# Patient Record
Sex: Female | Born: 1935 | Race: White | Hispanic: No | Smoking: Former smoker
Health system: Southern US, Community
[De-identification: ages and names within clinical notes are randomized; demographics above are authoritative.]

## PROBLEM LIST (undated history)

## (undated) DIAGNOSIS — I10 Essential (primary) hypertension: Secondary | ICD-10-CM

## (undated) DIAGNOSIS — C50919 Malignant neoplasm of unspecified site of unspecified female breast: Secondary | ICD-10-CM

## (undated) DIAGNOSIS — D471 Chronic myeloproliferative disease: Secondary | ICD-10-CM

## (undated) DIAGNOSIS — R809 Proteinuria, unspecified: Secondary | ICD-10-CM

## (undated) DIAGNOSIS — E559 Vitamin D deficiency, unspecified: Secondary | ICD-10-CM

## (undated) DIAGNOSIS — M810 Age-related osteoporosis without current pathological fracture: Secondary | ICD-10-CM

## (undated) DIAGNOSIS — T783XXA Angioneurotic edema, initial encounter: Secondary | ICD-10-CM

## (undated) DIAGNOSIS — M549 Dorsalgia, unspecified: Secondary | ICD-10-CM

## (undated) HISTORY — DX: Chronic myeloproliferative disease: D47.1

## (undated) HISTORY — DX: Essential (primary) hypertension: I10

## (undated) HISTORY — DX: Proteinuria, unspecified: R80.9

## (undated) HISTORY — DX: Vitamin D deficiency, unspecified: E55.9

## (undated) HISTORY — DX: Malignant neoplasm of unspecified site of unspecified female breast: C50.919

## (undated) HISTORY — PX: SPINE SURGERY: SHX786

## (undated) HISTORY — DX: Dorsalgia, unspecified: M54.9

## (undated) HISTORY — DX: Angioneurotic edema, initial encounter: T78.3XXA

## (undated) HISTORY — PX: BREAST SURGERY: SHX581

## (undated) HISTORY — DX: Age-related osteoporosis without current pathological fracture: M81.0

---

## 2008-02-15 ENCOUNTER — Ambulatory Visit (HOSPITAL_COMMUNITY): Admission: RE | Admit: 2008-02-15 | Discharge: 2008-02-15 | Payer: Self-pay | Admitting: Neurosurgery

## 2008-02-20 ENCOUNTER — Inpatient Hospital Stay (HOSPITAL_COMMUNITY): Admission: EM | Admit: 2008-02-20 | Discharge: 2008-02-27 | Payer: Self-pay | Admitting: Emergency Medicine

## 2008-02-24 ENCOUNTER — Encounter: Payer: Self-pay | Admitting: Neurosurgery

## 2008-02-24 ENCOUNTER — Encounter (INDEPENDENT_AMBULATORY_CARE_PROVIDER_SITE_OTHER): Payer: Self-pay | Admitting: Neurosurgery

## 2008-02-25 ENCOUNTER — Ambulatory Visit: Payer: Self-pay | Admitting: Physical Medicine & Rehabilitation

## 2008-02-27 ENCOUNTER — Inpatient Hospital Stay (HOSPITAL_COMMUNITY)
Admission: RE | Admit: 2008-02-27 | Discharge: 2008-03-07 | Payer: Self-pay | Admitting: Physical Medicine & Rehabilitation

## 2008-06-27 ENCOUNTER — Encounter: Admission: RE | Admit: 2008-06-27 | Discharge: 2008-06-27 | Payer: Self-pay | Admitting: Neurosurgery

## 2009-02-24 ENCOUNTER — Ambulatory Visit (HOSPITAL_COMMUNITY): Admission: RE | Admit: 2009-02-24 | Discharge: 2009-02-24 | Payer: Self-pay | Admitting: Neurosurgery

## 2010-03-07 ENCOUNTER — Encounter: Admission: RE | Admit: 2010-03-07 | Discharge: 2010-03-07 | Payer: Self-pay | Admitting: Geriatric Medicine

## 2010-06-12 ENCOUNTER — Encounter: Payer: Self-pay | Admitting: Neurosurgery

## 2010-07-19 ENCOUNTER — Other Ambulatory Visit: Payer: Self-pay | Admitting: Neurosurgery

## 2010-07-19 DIAGNOSIS — M542 Cervicalgia: Secondary | ICD-10-CM

## 2010-07-26 ENCOUNTER — Ambulatory Visit
Admission: RE | Admit: 2010-07-26 | Discharge: 2010-07-26 | Disposition: A | Discharge status: Home / Self Care | Payer: Medicare Other | Source: Ambulatory Visit | Attending: Neurosurgery | Admitting: Neurosurgery

## 2010-07-26 DIAGNOSIS — M542 Cervicalgia: Secondary | ICD-10-CM

## 2010-07-26 MED ORDER — GADOBENATE DIMEGLUMINE 529 MG/ML IV SOLN
10.0000 mL | Freq: Once | INTRAVENOUS | Status: AC | PRN
  Administered 2010-07-26: 10 mL via INTRAVENOUS

## 2010-08-25 LAB — BASIC METABOLIC PANEL
BUN: 15 mg/dL (ref 6–23)
CO2: 32 mEq/L (ref 19–32)
Calcium: 10.2 mg/dL (ref 8.4–10.5)
Chloride: 97 mEq/L (ref 96–112)
Creatinine, Ser: 0.96 mg/dL (ref 0.4–1.2)

## 2010-08-25 LAB — CBC
MCHC: 33.7 g/dL (ref 30.0–36.0)
MCV: 95.6 fL (ref 78.0–100.0)
Platelets: 271 10*3/uL (ref 150–400)
WBC: 6.4 10*3/uL (ref 4.0–10.5)

## 2010-09-09 ENCOUNTER — Other Ambulatory Visit: Payer: Self-pay | Admitting: Dermatology

## 2010-10-04 NOTE — H&P (Signed)
[name], [name] NO.:  [id number]   MEDICAL RECORD NO.:  [id number]          PATIENT TYPE:  IPS   LOCATION:  NA                           FACILITY:  [facility]   PHYSICIAN:  Ellwood Dense, M.D.   DATE OF BIRTH:  June 04, 1931   DATE OF ADMISSION:  DATE OF DISCHARGE:                              HISTORY & PHYSICAL   PRIMARY CARE PHYSICIAN:  Dr. Geralynn Ochs in [place], [place], and  neurosurgeon, Dr. Terrilee Files.   HISTORY OF PRESENT ILLNESS:  [name] is a 77 year old Caucasian female  with history of hypertension and breast cancer.  She has a history of  pain and numbness and tingling of her left arm for the past few months  with an MRI scan approximately 3 weeks ago showing a C7-T1 tumor.   The patient was seen by Dr. Lovell Sheehan February 10, 2008, with unsteady  gait and myelopathic features.  Repeat scans were ordered.  The patient  developed increased problems with gait and perineal numbness and  incontinence and was admitted February 20, 2008, for ongoing workup and  treatment.   The patient was started on IV Decadron.  MRI study of the cervical spine  February 20, 2008, showed an enhancing nerve root or nerve root mass from  C3-T1 bilaterally with the most massive lesion at C8 with encroachment  on the spinal canal and bilateral core deformity with abnormal  enhancement of the C7 vertebral body.   The patient underwent bilateral C7-T1 laminectomy for resection of the  mass with posterior fusion C5-T2  Dr. Lovell Sheehan February 24, 2008.  The  pathology on the mass was positive for neurofibroma with no ongoing  chemotherapy or radiation recommended.  Therapy evals have been ongoing.  The patient is impulsive and needs queues to pace herself.  She has  limited use of the bilateral upper extremities with a rolling walker.   The patient was evaluated by the rehabilitation physicians and felt to  be an appropriate candidate for inpatient rehabilitation.   REVIEW OF  SYSTEMS:  Positive for wound healing, numbness of her buttock  and perineum, weakness and constipation.   PAST MEDICAL HISTORY:  1. History of breast cancer with left mastectomy in 1986.  2. 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  3. Hypertension.  4. Osteoporosis.  5. Hysterectomy.  6. Prior ovarian cyst excision.  7. Prior appendectomy.   FAMILY HISTORY:  Positive for diabetes mellitus and cancer.   SOCIAL HISTORY:  The patient is widowed and lives alone in a 1-level  home in [place], [place], with 4 steps to enter from the side with the  front steps being assessable over grass and then she has to negotiate 1  step.  There is a daughter and grandson who lives close by the patient  in [place], [place].  The patient does not use alcohol or tobacco.   FUNCTIONAL HISTORY PRIOR TO ADMISSION:  Independent and driving a few  weeks prior to admission.   ALLERGIES:  No known drug allergies.   MEDICATIONS PRIOR TO  ADMISSION:  1. Fosamax 70 mg every week.  2. Potassium chloride 20 mEq daily.  3. Quinapril 20 mg daily.  4. Hydrochlorothiazide 50 mg daily.  5. Calcium with vitamin D b.i.d.  6. Nystatin 4 times daily.  7. Decadron 4 mg t.i.d.  8. Aspirin 81 mg daily.   LABORATORY:  Recent hemoglobin was 14.4 with hematocrit of 43.0,  platelet count of 351,000 and white count of 18.6 on steroids.  Recent  sodium is 129, potassium 3.0, chloride 91, bicarbonate 29, BUN 34 and  creatinine 1.02 with glucose of 114.  Repeat sodium was 133, potassium  4.3, February 24, 2008, with repeat hemoglobin 13.9 and 41.0 February 24, 2008.  Abdominal x-rays showed mild to moderate amount of feces throughout the  colon February 21, 2008.   PHYSICAL EXAM:  Well-appearing elderly small adult female seated up in a  chair with hard cervical collar in place.  Blood pressure is 120/77 with pulse of 87, respiratory rate 20 and  temperature 98.1.  HEENT:   Normocephalic, nontraumatic.  CARDIOVASCULAR:  Regular rate and rhythm.  S1, S2 without murmurs.  ABDOMEN:  Soft, nontender with positive bowel sounds.  LUNGS:  Were clear to auscultation bilaterally.  The patient has a  cervical hard collar in place with a healing wound posteriorly.  NEUROLOGIC:  Alert and oriented x3.  Cranial nerves II-XII are intact.  Bilateral upper extremity exam showed 4-/5 strength throughout.  Bulk  and tone were normal.  Reflexes 2+ and symmetrical.  Sensation was  intact to light touch with bilateral upper extremities.  Lower extremity  exam showed 4-/5 strength in hip flexion, knee extension and ankle  dorsiflexion.  Sensation was intact to light touch with the bilateral  lower extremities.   DIAGNOSES:  1. Status post neurofibroma resection C7-T1 for cord      impingement/myelopathy with subsequent C5-T2 decompression/fusion      February 25, 2008.  2. Hyponatremia improving with recheck of labs in the morning.  3. Renal insufficiency on admission.  4. Hypertension.  5. Bowel constipation.   1. Presently the patient will be admitted to receive collaborative and      interdisciplinary care between the physiatrist, rehab nursing staff      and therapy team.  The patient's level of medical complexity and      substantial therapy needs in context of that medical necessity      cannot be provided at a lesser intensity of care.  Physiatrist will      provide 24-hour management of medical needs as well as oversight of      the therapy plan/treatment and provide guidance as appropriate      regarding interaction is his tube.  Twenty-four hour rehab nursing      will assist in management of bowel and bladder and help integrate      therapy concepts, techniques and education.  Physical therapy will      assess and treat for range of motion, strengthening, bed mobility,      transfers, pre-gait training, gait training and equipment eval.  2. Occupational therapy  will assess and treat for range of motion,      strengthening, ADLs, cognitive/perceptional training, splinting and      equipment eval.  3. Speech therapy will assess and treat for dysphagia, oral motor      exercise, communication aids, aphasia and vital stim along with      cognitive eval.  4. Case management and social  worker will assess and treat for      psychosocial issues and discharge planning as appropriate.  Team      conferences will be held weekly to [place] to establish goals, assess      progress and determine barriers to discharge.   PROGNOSIS:  Good.   ESTIMATED LENGTH OF STAY:  Ten to twenty days.   GOALS:  Modified independent, ADLs, transfers and ambulation household  distances.           ______________________________  Ellwood Dense, M.D.     DC/MEDQ  D:  02/27/2008  T:  02/27/2008  Job:  [id number]

## 2010-10-04 NOTE — Op Note (Signed)
[name], [name]              ACCOUNT NO.:  [id number]   MEDICAL RECORD NO.:  [id number]          PATIENT TYPE:  INP   LOCATION:  3114                         FACILITY:  [facility]   PHYSICIAN:  Cristi Loron, M.D.DATE OF BIRTH:  [birth year]   DATE OF PROCEDURE:  02/24/2008  DATE OF DISCHARGE:                               OPERATIVE REPORT   BRIEF HISTORY:  The patient is a 77 year old white female who has had  progressive difficulty with walking and more recently with some perineal  numbness.  She was worked up with a cervical MRI, which demonstrated the  patient had bilateral interspinal lesions at C7-T1, which were  compressing the spinal cord.  She had small releases elsewhere as well.  I discussed the various treatment options with the patient and her  family including surgery.  We fully discussed the surgery including the  risk of significant bilateral hand weakness following the surgery.  The  patient and her family have weighed the risks, benefits, and  alternatives of the surgery and desired to proceed with a cervical  laminectomy to remove the intraspinal lesion at C7-T1 bilaterally as  well as perform posterior fusion instrumentation.   PREOPERATIVE DIAGNOSES:  Bilateral C7-T1 intradural extramedullary  lesions, cervical myelopathy, radiculopathy, cervicalgia.   POSTOPERATIVE DIAGNOSES:  Bilateral C7-T1 intradural extramedullary  lesions, cervical myelopathy, radiculopathy, cervicalgia.   PROCEDURES:  C7 and T1 laminectomy for resection of bilateral intradural  extramedullary tumors using microdissection; posterior cervical fusion  C5-6, 6-7,  7-1, 1-2 with local morselized autograft bone and Actifuse  bone graft extender; posterior cervical instrumentation with lateral  mass and pedicle screws from C5-T1 bilaterally.   SURGEON:  Cristi Loron, MD   ASSISTANT:  Stefani Dama, MD   ANESTHESIA:  General endotracheal.   ESTIMATED BLOOD LOSS:  300 mL.   SPECIMENS:  Tumor.   DRAINS:  None.   COMPLICATIONS:  None.   DESCRIPTION OF PROCEDURE:  The patient was brought to the operating room  by the anesthesia team.  General endotracheal anesthesia was induced.  I  then applied the radiolucent Mayfield head holder to the patient's  calvarium.  The patient was then carefully turned to the prone position  on the chest rolls.  The patient's suboccipital region was then shaved  with clippers and this region as well as the neck and upper thorax was  then prepared with Betadine scrub and Betadine solution.  Sterile drapes  were applied.  The area to be incised was then injected with Marcaine  with epinephrine solution and then used a scalpel to make a linear  midline incision over the cervicothoracic junction.  We used  electrocautery to perform bilateral subperiosteal dissection exposing  spinous process lamina of C5-6, 7-1, 2-3.  We obtained intraoperative  radiograph to confirm our location.  We began the decompression by  incising the interspinous ligament at C6-7, 7-1, and 1-2.  I then used  Leksell rongeur to remove the spinous process at C7-T1.  We saved this  bone and later morsed, cleared off soft tissue and morselized it to be  used as local  autograft bone and fusion process.  We then brought the  operating microscope into the field.  Under its magnification  illumination, we completed the microdissection/decompression.  I used  high-speed drill to thin out the C7-T1 lamina until there was only very  thin rim of cortical bone, as well as exposing ligament flavum.  We then  used the Kerrison punch to complete the laminectomy at C7-T1 as well as  removed the ligament flavum at C6-7, 7-1, and 1-2 to expose the thecal  sac.  We then used high-speed to drill away the medial aspect of the  bilateral C7-T1 facet joint.  We identified the exiting C8 nerve roots  and performed wide foraminotomies.  I then removed a good bit of the  facet to  otain a broad exposure.  The nerve roots were obviously quite  enlarged.  We then incised the patient's dura in the midline over C7-T1  and then we made a cruciate extending incision down over the nerve root  sleeve.  We did this under the magnification illumination of the  microscope.  We then used microdissection to dissect through the  arachnoid and then the bilateral tumors were almost immediately evident.  We began the decompression on the left side.  We inspected the fairly  large tumor, which appeared to be likely a neurofibroma, which appeared  to be emanating from the dorsal rootlets.  We carefully identified the  proximal extent of this tumor.  We then incised it with the  microscissors from the spinal cord.  We then were able to peel the tumor  off laterally and then the specimen from the pathology came back  consistent with a neurofibroma.  I, therefore, removed the tumor.  We  were able to identify the ventral C8 nerve roots.  It did appear mildly  enlarged, but did not seem to contain obvious tumor.  We then used  microdissection and were able to remove or dissect the tumor, which  appeared to emanate from the dorsal nerve rootlets away from the ventral  nerve roots, i.e. we did this in order to hopefully preserve the  patient's hand strength.  We then used a CUSA to remove the tumor out  laterally about to the lateral aspect of the patient's facet joint.  After we were satisfied with this removal of the tumor from left side,  we repeated this procedure and now especially on the right side again  noting that the tumor seemed emanating from the dorsal nerve rootlets.  We dissect away ventral nerve roots and removed the tumor through the  extent of the neural foramen.  We did not followed the tumor outlet of  the soft tissues because at this point, we had a good decompression of  the spinal cord and we hoped to prefer to preserve the ventral nerve  rootlets.  We then obtained  hemostasis using bipolar cautery and  Gelfoam.  We then reapproximated the patient's dura using interrupted 4-  0 Nurolon suture.  Despite the extensive durotomy, we got a pretty good  dural closure.  We then irrigated the epidural space out with bacitracin  solution and then placed Tisseel over the durotomy.   Having completed the bilateral removal of this tumor, we now turned our  attention to the instrumentation.  Under AP fluoroscopic guidance, we  cannulated the bilateral T1 and T2 pedicles with the awl and then  drilled a 25 mm hole.  We probed inside the drill hole to rule out  cortical breeches.  We could not feel any using the ball probe.  We were  unable to get adequate lateral images and the placement of the screws  under AP fluoroscopy.  We then placed a 26 x 4 mm polyaxial screw in T1  and T2 pedicles laterally.  We got good bony purchase.  Under a lateral  fluoroscopic guidance, we then placed lateral mass screws bilaterally at  C5 and C6 (there was not enough facet left at C7 to place lateral mass  screw at that level).  We again got good bony purchase.  We then cut and  then bent rod in appropriate configuration and length, and then  connected unilateral screws with a rod.  We fastened into place with the  caps, which tightened appropriately.  We then placed a cross connector  and tightened appropriately.  This completed the instrumentation.   We now turned attention to the posterolateral arthrodesis.  We used high-  speed drill to decorticate the C5-C6 as well as T2 lamina and the  lateral masses at C5-6-7, T1-T2.  We then laid a combination of local  morselized autograft bone as well as Actifuse bone graft extender over  the decorticated posterior structures.  This completed the  posterolateral arthrodesis.   We then obtained hemostasis using bipolar cautery.  We irrigated the  wound out with bacitracin solution.  We removed the cerebellar  retractors.  We then  reapproximated the patient's cervical and thoracic  fascia with interrupted #1 Vicryl suture, subcutaneous tissue with  interrupted 2-0 Vicryl suture, and skin with a running 3-0 nylon suture.  We then coated the wound with bacitracin ointment, applied sterile  dressing, and then the drapes were removed.  The patient was subsequently turned to supine position.  The Mayfield 3-  point headrest was removed from her calvarium and she was subsequently  extubated and transported back to the ICU in stable condition.  All  sponge, instrument, and needle counts were correct at the end of this  case.      Cristi Loron, M.D.  Electronically Signed     JDJ/MEDQ  D:  02/24/2008  T:  02/25/2008  Job:  [id number]

## 2010-10-04 NOTE — H&P (Signed)
[name], Nielsen              ACCOUNT NO.:  [id number]   MEDICAL RECORD NO.:  [id number]          PATIENT TYPE:  INP   LOCATION:  3114                         FACILITY:  [facility]   PHYSICIAN:  Cristi Loron, M.D.DATE OF BIRTH:  11-Apr-1932   DATE OF ADMISSION:  02/20/2008  DATE OF DISCHARGE:                              HISTORY & PHYSICAL   Chief complaint is perineal numbness.   HISTORY OF PRESENT ILLNESS:  The patient is a 77 year old white female  who last couple of months had developed pain, numbness, and tingling to  her left arm.  She was worked up with a cervical MRI, which demonstrated  cervical C7-T1 tumor.  She was sent for my consultation.  I saw her in  the office on February 10, 2008.  At that time, the patient was  myelopathic and had difficulty with ambulation with unsteady gait.  Because the resolution of the scan that the patient brought in with her  was low, I recommend we get a repeat cervical MRI in a close scanner  with and without contrast.  The patient was sent to [facility]  for that scan on February 15, 2008.  Evidently, there was some issues  with her labs.  They could not confirm that her BUN and creatinine were  okay.  Review scan showed she got a noncontrasted cervical and thoracic  MRI scan.  I spoke to the patient via telephone this week and we  arranged for her to get  the remainder of the study, contrast scan and  that was due to be done next week and the patient was tentatively  scheduled for surgery next week.   In the interim, the patient feels that she is having more trouble when  walking.  She is quite unsteady on her feet, but that does not seem  significantly different than it was a couple of weeks ago.  She has  noticed some perineal numbness and at times has some incontinence.   I have recommended the patient to come to the emergency department and I  evaluated her there.   For past medical history, past surgical history,  medication prior to  admission, drug allergies, family history, and social history etc.,  please refer to my office consultation on February 10, 2008, which I  have provided through the chart.   PHYSICAL EXAMINATION:  GENERAL:  A pleasant 77 year old white female in  no apparent distress.  NEUROLOGIC:  The patient's neurologic exam is as follows.  She is alert  and oriented x3.  The patient's motor strength is as follows.  The  patient has weakness in the bilateral hands and approximately 4/5.  The  patient's motor strength in her all four extremities is somewhat  difficult to accurately assess as she gives away easily but including in  her deltoids, biceps, triceps above the lesion and her psoas quadriceps,  gastrocnemius, extensor hallucis longus versus strengthening these  muscle groups seems to be at least 4+/5.  The patient has decreased  light touch station in lower extremity at the perineal area but does  have crude  sensation.  Deep tendon reflexes are 2+/4 in bilateral  quadriceps and 1/4 bilateral gastrocnemius.  There was no ankle clonus.   ASSESSMENT/PLAN:  C7-T1 tumor.  I have discussed the situation with the  patient and her son.  I have recommended that she be admitted for  observation and then we get a fusion cervical MRI today to better  characterize these lesions.  I have reviewed the patient's cervical MRI  scan for [name] on February 15, 2008, without contrast as  well as a thoracic MRI demonstrates bilateral lesions within neural  foramen, which resulted in some moderate spinal stenosis as above  because this is a noncontrast scan.  I did not see any significant  lesions in the thoracic region.  There is perhaps a small neurofibroma  C6-7.   ASSESSMENT AND PLAN:  Bilateral C7-T1 tumors.  These tumors appear to be  nursing to nerve sheath nerve tumors such as schwannomas, neurofibromas,  etc.  The patient's clinical exam has not changed dramatically  and she  has fairly normal strength except her hands.  I have recommended we  admit her for observation.  We started her on IV steroids and we get a  cervical MRI with contrast.   I again discussed surgery with her i.e. cervical laminectomy and removal  of these tumors with a cervical thoracic instrumentation and fusion.  I  told her that was she to do this surgery on February 24, 2008, but that if  she were to worsen in the interval, we may need to move up the surgery  date.  We will keep a close eye on her in the ICU.   I have answered all the patient's and her son's questions.      Cristi Loron, M.D.  Electronically Signed     JDJ/MEDQ  D:  02/20/2008  T:  02/21/2008  Job:  [id number]   cc:   Concepcion Living

## 2010-10-04 NOTE — Discharge Summary (Signed)
[name], [name]              ACCOUNT NO.:  [id number]   MEDICAL RECORD NO.:  [id number]          PATIENT TYPE:  IPS   LOCATION:  4004                         FACILITY:  [facility]   PHYSICIAN:  Ranelle Oyster, M.D.DATE OF BIRTH:  1931-08-16   DATE OF ADMISSION:  02/27/2008  DATE OF DISCHARGE:  03/07/2008                               DISCHARGE SUMMARY   DISCHARGE DIAGNOSES:  1. 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  2. Hyponatremia, improved.  3. Hypokalemia resolved with supplementation.  4. Hypertension.  5. Neurogenic bowel and bladder with improved function.  6. Klebsiella urinary tract infection, treated.   HISTORY OF PRESENT ILLNESS:  [name] is a 77 year old female with  history of hypertension, breast cancer, and a pain, numbness, and  tingling left arm for few months.  MRI done revealed C7-T1 tumor.  The  patient was seen by Dr. Lovell Sheehan on February 10, 2008 with unsteady gait  and myelopathy with repeat scans ordered.  The patient developed  increased problems with gait with perineal numbness and incoordination  and was admitted February 20, 2008 for workup and treatment.  The patient  was started on IV Decadron.  MRI of C-spine on February 20, 2008 showed  enhancing nerve root or nerve root masses from C3-T1 bilaterally, most  massive at CA with encroachment of spinal canal and bilateral cord  deformity with abnormal enhancement of C7.  The patient underwent  bilateral C7-T1 lam for resection of masses with posterior fusion C5-T2  by Dr. Lovell Sheehan on February 24, 2008.  Pathology was positive for  neurofibroma.  PT/OT evaluation was done and currently ongoing, the  patient is noted to have impairments in mobility and self-care and noted  to be impulsive needing cues to pace herself.  Noted to have limited use  of bilateral upper extremity and rolling walker.  Rehab was consulted  for progressive therapies.   PAST MEDICAL HISTORY:  Significant  for breast cancer with left  mastectomy in 1986, excision of squamous cell and basal cell cancer from  facial areas, hypertension, osteoporosis, hysterectomy, excision of  ovarian cyst, and appendectomy.   ALLERGIES:  No known drug allergies.   FAMILY HISTORY:  Positive for diabetes and cancer.   SOCIAL HISTORY:  The patient is widowed, lives alone in one level home  with 4 steps at entry.  Family to assist a few weeks past discharge.  The patient does not use any tobacco or alcohol.   FUNCTIONAL HISTORY:  The patient was independent in driving until few  weeks prior to admission.   FUNCTIONAL STATUS:  The patient is min assist for bed mobility, min  assist for transfers, min assist for transfers, min assist for  ambulating 60 feet.  Currently requires min assist for bathing, mod  assist for dressing.   HOSPITAL COURSE:  [name] was admitted to rehab on February 27, 2008 for inpatient therapies to consist of PT/OT and speech therapy at  least 5 days a week.  During his stay in rehab, a rehab RN has been  following and assisting with bowel and bladder training,  wound care as  well as skin care monitoring.  We have also been encouraging the patient  to improve her p.o. intake, to maintain hydration and nutritional  status.  Physiatry speech rehab RN and Therapy Team have been working  together to provide customized collaborative interdisciplinary care  during this stay.  Rehab Team has been holding weekly conferences to  assess the patient's progress goals and discuss barriers to discharge.  The patient was noted to have issues with hyponatremia with sodium at  129 at admission.  Her routine check of lytes have been monitored during  this stay with close monitoring of her hyponatremia.  At time of  admission, sodium was noted to be at 133.  Initially, the patient was  maintained on hydrochlorothiazide 50 mg a day without any potassium  supplements.  On recheck of lytes, she  was noted to be hypokalemic with  potassium down to 3.2.  She was aggressively supplemented.  The patient  reports taking potassium supplements on b.i.d. basis at home.  This was  resumed.  Last check of lytes of March 06, 2008 shows improvement in  her hypokalemia with sodium 133, potassium 3.4, chloride 93, CO2 31, BUN  13, creatinine 0.85, and glucose 104.  The patient to be discharged on  b.i.d. potassium supplements with follow up at [facility].  The patient's  blood pressures have been checked on b.i.d. basis during this stay.  These have been variable, ranging from 100s to 120 systolic, 60s to 16X  diastolic.  Heart rate has been stable.  Last weight is at 53 kg.  The  patient reported issues with constipation.  Bowel program was initiated  with use of multiple laxatives as well as enemas to help with  evacuation.  The KUB was done on March 03, 2008, to see effectiveness  of bowel program.  This showed large amount of colonic stool, although  decreased stool in rectosigmoid since the prior exam of February 21, 2008.  The patient was treated with magnesium citrate rate and current bowel  program has been adjusted to Senokot S one b.i.d. with milk of magnesia  at bedtime and Dulcolax suppository in the a.m. if no results.  The  patient is advised to keep to this program for now and to adjust it up  or down to help with workup any issues regarding constipation.  Currently, the patient reports bowel movement couple of times a day  without any complaints of GI distress.   The patient's voiding was monitored with PVR checks with nursing setting  a toileting schedule to help the patient with bladder program.  The  patient was noted to have issues with urinary retention and she was  started on Urecholine 25 mg with t.i.d.  UA/UC  was sent off and the  patient was noted to have Klebsiella UTI and has been treated with 7 day  Cipro for this.  Past discharge, the patient to be tapered off   Urecholine over the next 10 days.  The patient's pain control has been  reasonable with a p.r.n. use of oxycodone.  In the last 24 hours prior  to discharge, the patient reports much improvement in pain management  with no limitations due to pain.  At time of admission, the patient was  noted to be limited by pain in upper thoracic region with movement.  Rehab RN has been working with therapy to premedicate the patient with  pain meds prior to therapy to help improve her  participation and  endurance of therapies.  The patient was noted to have decrease in  bilateral upper extremity strength, a decrease in left upper extremity  sensation, and decreased in dynamic and static standing balance  requiring min to mod assist for all functional mobility.  OT has been  working with the patient to help with bilateral upper extremity  strengthening with bilateral upper extremity exercises to perform as  well as dynamic standing balance with activity to focus on balance with  rolling walker support as well as rolling walker safety.  The patient is  currently able to perform bathing and dressing at shower level at  modified independent level with assist to change pads and collar.  She  requires supervision to step over ledge and shower stall.  Therapy has  also been working on challenging activities in picking items off floor  and the patient is currently mod independent with the use of rolling  walker for support.  Physical therapy has been working with the patient  for mobility.  Currently, the patient has progressed to being modified  independent for transfers, modified independent for ambulating 300 feet  on unit with rolling walker, requires supervision for high car transfer,  able to navigate 5 steps with supervision, 12 short steps with cane with  supervision only.  The patient and son and have fully participated in  transfer and mobility training.  The patient still presents with  decreased  balance with higher level activities.  Family is to provide  intermittent supervision for navigation of stairs and cars and during  the day at home as needed.  The patient will also continue with further  progressive home health PT/OT by [facility] past  discharge.  On March 06, 2008, the patient is discharged to home.   DISCHARGE MEDICATIONS:  1. Hydrochlorothiazide 50 mg p.o. per day.  2. Multivitamin one per day.  3. K-Dur 20 mEq b.i.d.  4. Pepcid 20 mg b.i.d.  5. Os-Cal plus D one p.o. b.i.d.  6. Quinapril 20 mg a day.  7. Senokot-S one p.o. b.i.d.  8. Dulcolax suppository one q.a.m. if no BM.  9. Milk of magnesia 30 mL p.o. per day at bedtime.  10.OxyIR 5 mg one to two q.4-6h. p.r.n. pain, #50 RX.  11.Cipro 250 mg one p.o. b.i.d. for 2 additional days.  12.Urecholine 25 mg one p.o. b.i.d. for 5 days then decrease to one      per day until gone.  13.Tylenol 325 mg 1-2 q.4h. p.r.n. pain.   Diet is low-salt.  Activity level is at intermittent supervision.  No  strenuous activity.  No alcohol, no smoking, no driving.  Special  instructions keep neck incision clean and dry.  Wear a collar at all  times.   FOLLOWUP:  The patient to follow up with Dr. Riley Kill as needed.  Followup  with Dr. Tressie Stalker for postop check and for input regarding  cervical collar.  Followup with Dr. Elijah Birk in 2 weeks for routine  check as well as check of lytes.      Greg Cutter, P.A.      Ranelle Oyster, M.D.  Electronically Signed    PP/MEDQ  D:  03/06/2008  T:  03/07/2008  Job:  [id number]   cc:   Cristi Loron, M.D.  Dr. Elijah Birk

## 2010-10-07 NOTE — Discharge Summary (Signed)
NAMEASAL, TEAS              ACCOUNT NO.:  [id number]   MEDICAL RECORD NO.:  [id number]          PATIENT TYPE:  INP   LOCATION:  3039                         FACILITY:  [facility]   PHYSICIAN:  Cristi Loron, M.D.DATE OF BIRTH:  07/11/1931   DATE OF ADMISSION:  02/20/2008  DATE OF DISCHARGE:  02/27/2008                               DISCHARGE SUMMARY   BRIEF HISTORY:  The patient is a 77 year old white female who has had  progressive difficulty walking and more recently with some perineal  numbness.  She was worked up with a cervical MRI which demonstrated the  patient with bilateral intraspinal lesion to C7-T1 which were  compressing spinal cord.  She has small tumors elsewhere.  I have  discussed various treatment options with the patient and her family  including surgery.  We fully discussed the surgery and the risk of  significant bilateral hand weakness following the surgery.  The patient  and her family weighed the risks, benefits, and alternatives of the  surgery and decided to proceed with a cervical laminectomy to remove the  intraspinal tumor at C7-T1 as well as perform posterior cervical  instrumentation and fusion.   For further details of this admission, please refer to typed history and  physical.   HOSPITAL COURSE:  I admitted the patient to [facility] on  February 20, 2008.  On February 24, 2008, I performed a C7-T1 laminectomy  for resection of bilateral intradural extramedullary tumors using  microdissection as well as a posterior instrumentation of C5-T1  bilaterally.  The surgery went well (for full details of this operation,  please refer to the typed operative note).   POSTOPERATIVE COURSE:  The patient's postoperative course was  unremarkable.  By February 27, 2008, the patient was afebrile.  Vital  signs stable.  She was eating well, ambulating well and she was felt to  be stable for transferring the patient to rehab unit.  She was  transferred on  February 27, 2008.   DISCHARGE INSTRUCTIONS:  The patient is instructed to follow up with me  in 4 weeks.  She was given written discharge instructions.   FINAL DIAGNOSES:  1. Bilateral C7-T1 intradural extramedullary lesions.  2. Cervical myelopathy.  3. Radiculopathy.  4. Cervicalgia.   PROCEDURE PERFORMED:  C7-T1 laminectomy for resection of bilateral  intradural extramedullary tumors using microdissection; posterior  cervical fusion of C5-C6, C6-C7, C7-T1, and T1-T2 with local morselized  autograft bone and Actifuse bone graft extender; posterior cervical  instrumentation with bilateral lateral mass screws from C5-T1  bilaterally.     Cristi Loron, M.D.  Electronically Signed    JDJ/MEDQ  D:  04/02/2008  T:  04/03/2008  Job:  [id number]

## 2011-01-11 ENCOUNTER — Other Ambulatory Visit: Payer: Self-pay | Admitting: Dermatology

## 2011-02-21 LAB — COMPREHENSIVE METABOLIC PANEL
ALT: 25
AST: 27
Albumin: 3.4 — ABNORMAL LOW
Alkaline Phosphatase: 34 — ABNORMAL LOW
CO2: 31
Calcium: 8.3 — ABNORMAL LOW
Chloride: 91 — ABNORMAL LOW
Creatinine, Ser: 0.55
GFR calc Af Amer: >60
GFR calc non Af Amer: >60
Glucose, Bld: 99
Potassium: 3 — ABNORMAL LOW
Sodium: 133 — ABNORMAL LOW
Total Bilirubin: 1
Total Protein: 4.3 — ABNORMAL LOW

## 2011-02-21 LAB — BASIC METABOLIC PANEL
CO2: 29
Calcium: 8.9
Calcium: 9
Chloride: 97
GFR calc Af Amer: >60
GFR calc Af Amer: >60
GFR calc non Af Amer: >60
GFR calc non Af Amer: >60
Glucose, Bld: 104 — ABNORMAL HIGH
Glucose, Bld: 99
Potassium: 3.2 — ABNORMAL LOW
Potassium: 4
Sodium: 129 — ABNORMAL LOW
Sodium: 133 — ABNORMAL LOW
Sodium: 134 — ABNORMAL LOW

## 2011-02-21 LAB — DIFFERENTIAL
Basophils Absolute: 0
Basophils Relative: 0
Eosinophils Absolute: 0
Eosinophils Relative: 0
Lymphocytes Relative: 17
Lymphocytes Relative: 8 — ABNORMAL LOW
Lymphs Abs: 1.7
Monocytes Absolute: 1.5 — ABNORMAL HIGH
Monocytes Relative: 7
Neutro Abs: 7.4
Neutrophils Relative %: 73

## 2011-02-21 LAB — URINE CULTURE: Special Requests: NEGATIVE

## 2011-02-21 LAB — POCT I-STAT 4, (NA,K, GLUC, HGB,HCT)
Glucose, Bld: 138 — ABNORMAL HIGH
HCT: 33 — ABNORMAL LOW
Hemoglobin: 11.2 — ABNORMAL LOW
Hemoglobin: 13.9
Potassium: 4.1
Potassium: 4.3
Sodium: 132 — ABNORMAL LOW

## 2011-02-21 LAB — HEMOGLOBIN A1C
Hgb A1c MFr Bld: 5.8
Mean Plasma Glucose: 120

## 2011-02-21 LAB — URINALYSIS, ROUTINE W REFLEX MICROSCOPIC
Glucose, UA: NEGATIVE
Glucose, UA: NEGATIVE
Hgb urine dipstick: NEGATIVE
Ketones, ur: NEGATIVE
Ketones, ur: NEGATIVE
Protein, ur: NEGATIVE
pH: 6
pH: 8

## 2011-02-21 LAB — GLUCOSE, CAPILLARY: Glucose-Capillary: 132 — ABNORMAL HIGH

## 2011-02-21 LAB — CBC
Hemoglobin: 10.8 — ABNORMAL LOW
Hemoglobin: 9.9 — ABNORMAL LOW
MCHC: 33.6
MCHC: 33.9
MCV: 98.8
MCV: 98.9
Platelets: 351
RBC: 2.99 — ABNORMAL LOW
RBC: 3.23 — ABNORMAL LOW
RBC: 4.4
RDW: 13.5
WBC: 17.1 — ABNORMAL HIGH
WBC: 18.6 — ABNORMAL HIGH

## 2011-02-21 LAB — MAGNESIUM: Magnesium: 2

## 2011-02-21 LAB — POCT I-STAT GLUCOSE: Operator id: 173791

## 2011-02-21 LAB — URINE MICROSCOPIC-ADD ON

## 2011-03-07 ENCOUNTER — Other Ambulatory Visit: Payer: Self-pay | Admitting: Dermatology

## 2011-04-27 ENCOUNTER — Other Ambulatory Visit: Payer: Self-pay | Admitting: Dermatology

## 2011-06-27 DIAGNOSIS — Z833 Family history of diabetes mellitus: Secondary | ICD-10-CM | POA: Diagnosis not present

## 2011-06-27 DIAGNOSIS — R9431 Abnormal electrocardiogram [ECG] [EKG]: Secondary | ICD-10-CM | POA: Diagnosis not present

## 2011-06-27 DIAGNOSIS — Z01810 Encounter for preprocedural cardiovascular examination: Secondary | ICD-10-CM | POA: Diagnosis not present

## 2011-06-27 DIAGNOSIS — Z87891 Personal history of nicotine dependence: Secondary | ICD-10-CM | POA: Diagnosis not present

## 2011-06-27 DIAGNOSIS — Z9010 Acquired absence of unspecified breast and nipple: Secondary | ICD-10-CM | POA: Diagnosis not present

## 2011-06-27 DIAGNOSIS — Z85828 Personal history of other malignant neoplasm of skin: Secondary | ICD-10-CM | POA: Diagnosis not present

## 2011-06-27 DIAGNOSIS — H02429 Myogenic ptosis of unspecified eyelid: Secondary | ICD-10-CM | POA: Diagnosis not present

## 2011-06-27 DIAGNOSIS — I498 Other specified cardiac arrhythmias: Secondary | ICD-10-CM | POA: Diagnosis not present

## 2011-06-27 DIAGNOSIS — I10 Essential (primary) hypertension: Secondary | ICD-10-CM | POA: Diagnosis not present

## 2011-06-27 DIAGNOSIS — Z8249 Family history of ischemic heart disease and other diseases of the circulatory system: Secondary | ICD-10-CM | POA: Diagnosis not present

## 2011-06-27 DIAGNOSIS — Z90710 Acquired absence of both cervix and uterus: Secondary | ICD-10-CM | POA: Diagnosis not present

## 2011-06-27 DIAGNOSIS — Z853 Personal history of malignant neoplasm of breast: Secondary | ICD-10-CM | POA: Diagnosis not present

## 2011-07-05 DIAGNOSIS — Z85828 Personal history of other malignant neoplasm of skin: Secondary | ICD-10-CM | POA: Diagnosis not present

## 2011-07-05 DIAGNOSIS — H02429 Myogenic ptosis of unspecified eyelid: Secondary | ICD-10-CM | POA: Diagnosis not present

## 2011-07-05 DIAGNOSIS — C50919 Malignant neoplasm of unspecified site of unspecified female breast: Secondary | ICD-10-CM | POA: Diagnosis not present

## 2011-07-05 DIAGNOSIS — Z9010 Acquired absence of unspecified breast and nipple: Secondary | ICD-10-CM | POA: Diagnosis not present

## 2011-07-05 DIAGNOSIS — Z8249 Family history of ischemic heart disease and other diseases of the circulatory system: Secondary | ICD-10-CM | POA: Diagnosis not present

## 2011-07-05 DIAGNOSIS — Z90710 Acquired absence of both cervix and uterus: Secondary | ICD-10-CM | POA: Diagnosis not present

## 2011-07-05 DIAGNOSIS — Z9849 Cataract extraction status, unspecified eye: Secondary | ICD-10-CM | POA: Diagnosis not present

## 2011-07-05 DIAGNOSIS — I10 Essential (primary) hypertension: Secondary | ICD-10-CM | POA: Diagnosis not present

## 2011-07-05 DIAGNOSIS — Z833 Family history of diabetes mellitus: Secondary | ICD-10-CM | POA: Diagnosis not present

## 2011-07-05 DIAGNOSIS — H02409 Unspecified ptosis of unspecified eyelid: Secondary | ICD-10-CM | POA: Diagnosis not present

## 2011-07-05 DIAGNOSIS — Z87891 Personal history of nicotine dependence: Secondary | ICD-10-CM | POA: Diagnosis not present

## 2011-07-10 DIAGNOSIS — H02409 Unspecified ptosis of unspecified eyelid: Secondary | ICD-10-CM | POA: Diagnosis not present

## 2011-07-10 DIAGNOSIS — Z4802 Encounter for removal of sutures: Secondary | ICD-10-CM | POA: Diagnosis not present

## 2011-07-13 DIAGNOSIS — I10 Essential (primary) hypertension: Secondary | ICD-10-CM | POA: Diagnosis not present

## 2011-07-13 DIAGNOSIS — Z79899 Other long term (current) drug therapy: Secondary | ICD-10-CM | POA: Diagnosis not present

## 2011-07-13 DIAGNOSIS — R209 Unspecified disturbances of skin sensation: Secondary | ICD-10-CM | POA: Diagnosis not present

## 2011-08-08 DIAGNOSIS — H02409 Unspecified ptosis of unspecified eyelid: Secondary | ICD-10-CM | POA: Diagnosis not present

## 2011-09-12 DIAGNOSIS — M961 Postlaminectomy syndrome, not elsewhere classified: Secondary | ICD-10-CM | POA: Diagnosis not present

## 2011-09-12 DIAGNOSIS — IMO0002 Reserved for concepts with insufficient information to code with codable children: Secondary | ICD-10-CM | POA: Diagnosis not present

## 2011-10-31 DIAGNOSIS — I10 Essential (primary) hypertension: Secondary | ICD-10-CM | POA: Diagnosis not present

## 2011-10-31 DIAGNOSIS — K458 Other specified abdominal hernia without obstruction or gangrene: Secondary | ICD-10-CM | POA: Diagnosis not present

## 2011-12-11 DIAGNOSIS — N952 Postmenopausal atrophic vaginitis: Secondary | ICD-10-CM | POA: Diagnosis not present

## 2011-12-11 DIAGNOSIS — Z1212 Encounter for screening for malignant neoplasm of rectum: Secondary | ICD-10-CM | POA: Diagnosis not present

## 2011-12-11 DIAGNOSIS — Z01419 Encounter for gynecological examination (general) (routine) without abnormal findings: Secondary | ICD-10-CM | POA: Diagnosis not present

## 2011-12-11 DIAGNOSIS — R32 Unspecified urinary incontinence: Secondary | ICD-10-CM | POA: Diagnosis not present

## 2011-12-11 DIAGNOSIS — Z1231 Encounter for screening mammogram for malignant neoplasm of breast: Secondary | ICD-10-CM | POA: Diagnosis not present

## 2012-01-31 DIAGNOSIS — Z1331 Encounter for screening for depression: Secondary | ICD-10-CM | POA: Diagnosis not present

## 2012-01-31 DIAGNOSIS — M21619 Bunion of unspecified foot: Secondary | ICD-10-CM | POA: Diagnosis not present

## 2012-01-31 DIAGNOSIS — I10 Essential (primary) hypertension: Secondary | ICD-10-CM | POA: Diagnosis not present

## 2012-01-31 DIAGNOSIS — M810 Age-related osteoporosis without current pathological fracture: Secondary | ICD-10-CM | POA: Diagnosis not present

## 2012-01-31 DIAGNOSIS — Z0000 Encounter for general adult medical examination without abnormal findings: Secondary | ICD-10-CM | POA: Diagnosis not present

## 2012-01-31 DIAGNOSIS — Z79899 Other long term (current) drug therapy: Secondary | ICD-10-CM | POA: Diagnosis not present

## 2012-01-31 DIAGNOSIS — R413 Other amnesia: Secondary | ICD-10-CM | POA: Diagnosis not present

## 2012-01-31 DIAGNOSIS — K1370 Unspecified lesions of oral mucosa: Secondary | ICD-10-CM | POA: Diagnosis not present

## 2012-02-01 DIAGNOSIS — R6889 Other general symptoms and signs: Secondary | ICD-10-CM | POA: Diagnosis not present

## 2012-03-12 DIAGNOSIS — M25549 Pain in joints of unspecified hand: Secondary | ICD-10-CM | POA: Diagnosis not present

## 2012-03-12 DIAGNOSIS — M4120 Other idiopathic scoliosis, site unspecified: Secondary | ICD-10-CM | POA: Diagnosis not present

## 2012-04-03 DIAGNOSIS — L723 Sebaceous cyst: Secondary | ICD-10-CM | POA: Diagnosis not present

## 2012-04-03 DIAGNOSIS — R269 Unspecified abnormalities of gait and mobility: Secondary | ICD-10-CM | POA: Diagnosis not present

## 2012-04-03 DIAGNOSIS — M810 Age-related osteoporosis without current pathological fracture: Secondary | ICD-10-CM | POA: Diagnosis not present

## 2012-04-03 DIAGNOSIS — Z23 Encounter for immunization: Secondary | ICD-10-CM | POA: Diagnosis not present

## 2012-04-03 DIAGNOSIS — R413 Other amnesia: Secondary | ICD-10-CM | POA: Diagnosis not present

## 2012-07-24 DIAGNOSIS — I129 Hypertensive chronic kidney disease with stage 1 through stage 4 chronic kidney disease, or unspecified chronic kidney disease: Secondary | ICD-10-CM | POA: Diagnosis not present

## 2012-07-24 DIAGNOSIS — J309 Allergic rhinitis, unspecified: Secondary | ICD-10-CM | POA: Diagnosis not present

## 2012-07-24 DIAGNOSIS — R413 Other amnesia: Secondary | ICD-10-CM | POA: Diagnosis not present

## 2012-07-30 DIAGNOSIS — H9209 Otalgia, unspecified ear: Secondary | ICD-10-CM | POA: Diagnosis not present

## 2012-08-05 ENCOUNTER — Other Ambulatory Visit: Payer: Self-pay | Admitting: Geriatric Medicine

## 2012-08-06 ENCOUNTER — Ambulatory Visit
Admission: RE | Admit: 2012-08-06 | Discharge: 2012-08-06 | Disposition: A | Discharge status: Home / Self Care | Payer: Medicare Other | Source: Ambulatory Visit | Attending: Geriatric Medicine | Admitting: Geriatric Medicine

## 2012-08-06 DIAGNOSIS — R110 Nausea: Secondary | ICD-10-CM

## 2012-08-06 DIAGNOSIS — I6350 Cerebral infarction due to unspecified occlusion or stenosis of unspecified cerebral artery: Secondary | ICD-10-CM | POA: Diagnosis not present

## 2012-08-06 MED ORDER — GADOBENATE DIMEGLUMINE 529 MG/ML IV SOLN
11.0000 mL | Freq: Once | INTRAVENOUS | Status: AC | PRN
  Administered 2012-08-06: 11 mL via INTRAVENOUS

## 2012-08-20 DIAGNOSIS — R269 Unspecified abnormalities of gait and mobility: Secondary | ICD-10-CM | POA: Diagnosis not present

## 2012-08-20 DIAGNOSIS — I10 Essential (primary) hypertension: Secondary | ICD-10-CM | POA: Diagnosis not present

## 2012-08-20 DIAGNOSIS — I6350 Cerebral infarction due to unspecified occlusion or stenosis of unspecified cerebral artery: Secondary | ICD-10-CM | POA: Diagnosis not present

## 2012-08-23 DIAGNOSIS — G8100 Flaccid hemiplegia affecting unspecified side: Secondary | ICD-10-CM | POA: Diagnosis not present

## 2012-08-23 DIAGNOSIS — R269 Unspecified abnormalities of gait and mobility: Secondary | ICD-10-CM | POA: Diagnosis not present

## 2012-08-26 DIAGNOSIS — R269 Unspecified abnormalities of gait and mobility: Secondary | ICD-10-CM | POA: Diagnosis not present

## 2012-08-26 DIAGNOSIS — G8100 Flaccid hemiplegia affecting unspecified side: Secondary | ICD-10-CM | POA: Diagnosis not present

## 2012-08-28 DIAGNOSIS — G8100 Flaccid hemiplegia affecting unspecified side: Secondary | ICD-10-CM | POA: Diagnosis not present

## 2012-08-28 DIAGNOSIS — R269 Unspecified abnormalities of gait and mobility: Secondary | ICD-10-CM | POA: Diagnosis not present

## 2012-09-02 DIAGNOSIS — IMO0002 Reserved for concepts with insufficient information to code with codable children: Secondary | ICD-10-CM | POA: Diagnosis not present

## 2012-09-02 DIAGNOSIS — R269 Unspecified abnormalities of gait and mobility: Secondary | ICD-10-CM | POA: Diagnosis not present

## 2012-09-02 DIAGNOSIS — G8100 Flaccid hemiplegia affecting unspecified side: Secondary | ICD-10-CM | POA: Diagnosis not present

## 2012-09-05 DIAGNOSIS — R269 Unspecified abnormalities of gait and mobility: Secondary | ICD-10-CM | POA: Diagnosis not present

## 2012-09-05 DIAGNOSIS — G8100 Flaccid hemiplegia affecting unspecified side: Secondary | ICD-10-CM | POA: Diagnosis not present

## 2012-09-09 DIAGNOSIS — G8100 Flaccid hemiplegia affecting unspecified side: Secondary | ICD-10-CM | POA: Diagnosis not present

## 2012-09-09 DIAGNOSIS — R269 Unspecified abnormalities of gait and mobility: Secondary | ICD-10-CM | POA: Diagnosis not present

## 2012-09-10 DIAGNOSIS — M961 Postlaminectomy syndrome, not elsewhere classified: Secondary | ICD-10-CM | POA: Diagnosis not present

## 2012-09-10 DIAGNOSIS — M25549 Pain in joints of unspecified hand: Secondary | ICD-10-CM | POA: Diagnosis not present

## 2012-09-12 DIAGNOSIS — R269 Unspecified abnormalities of gait and mobility: Secondary | ICD-10-CM | POA: Diagnosis not present

## 2012-09-12 DIAGNOSIS — G8100 Flaccid hemiplegia affecting unspecified side: Secondary | ICD-10-CM | POA: Diagnosis not present

## 2012-09-16 DIAGNOSIS — G8100 Flaccid hemiplegia affecting unspecified side: Secondary | ICD-10-CM | POA: Diagnosis not present

## 2012-09-16 DIAGNOSIS — R269 Unspecified abnormalities of gait and mobility: Secondary | ICD-10-CM | POA: Diagnosis not present

## 2012-09-20 DIAGNOSIS — R269 Unspecified abnormalities of gait and mobility: Secondary | ICD-10-CM | POA: Diagnosis not present

## 2012-09-20 DIAGNOSIS — G8100 Flaccid hemiplegia affecting unspecified side: Secondary | ICD-10-CM | POA: Diagnosis not present

## 2012-09-23 DIAGNOSIS — G8100 Flaccid hemiplegia affecting unspecified side: Secondary | ICD-10-CM | POA: Diagnosis not present

## 2012-09-23 DIAGNOSIS — R269 Unspecified abnormalities of gait and mobility: Secondary | ICD-10-CM | POA: Diagnosis not present

## 2012-10-23 DIAGNOSIS — I10 Essential (primary) hypertension: Secondary | ICD-10-CM | POA: Diagnosis not present

## 2012-10-29 DIAGNOSIS — L439 Lichen planus, unspecified: Secondary | ICD-10-CM | POA: Diagnosis not present

## 2012-12-16 DIAGNOSIS — R32 Unspecified urinary incontinence: Secondary | ICD-10-CM | POA: Diagnosis not present

## 2012-12-16 DIAGNOSIS — Z1212 Encounter for screening for malignant neoplasm of rectum: Secondary | ICD-10-CM | POA: Diagnosis not present

## 2012-12-16 DIAGNOSIS — Z1231 Encounter for screening mammogram for malignant neoplasm of breast: Secondary | ICD-10-CM | POA: Diagnosis not present

## 2012-12-16 DIAGNOSIS — Z1289 Encounter for screening for malignant neoplasm of other sites: Secondary | ICD-10-CM | POA: Diagnosis not present

## 2012-12-16 DIAGNOSIS — N952 Postmenopausal atrophic vaginitis: Secondary | ICD-10-CM | POA: Diagnosis not present

## 2012-12-17 DIAGNOSIS — L723 Sebaceous cyst: Secondary | ICD-10-CM | POA: Diagnosis not present

## 2012-12-17 DIAGNOSIS — Z85828 Personal history of other malignant neoplasm of skin: Secondary | ICD-10-CM | POA: Diagnosis not present

## 2012-12-17 DIAGNOSIS — D485 Neoplasm of uncertain behavior of skin: Secondary | ICD-10-CM | POA: Diagnosis not present

## 2012-12-26 DIAGNOSIS — L439 Lichen planus, unspecified: Secondary | ICD-10-CM | POA: Diagnosis not present

## 2013-01-28 DIAGNOSIS — L439 Lichen planus, unspecified: Secondary | ICD-10-CM | POA: Diagnosis not present

## 2013-01-31 DIAGNOSIS — M47817 Spondylosis without myelopathy or radiculopathy, lumbosacral region: Secondary | ICD-10-CM | POA: Diagnosis not present

## 2013-01-31 DIAGNOSIS — M961 Postlaminectomy syndrome, not elsewhere classified: Secondary | ICD-10-CM | POA: Diagnosis not present

## 2013-01-31 DIAGNOSIS — R5381 Other malaise: Secondary | ICD-10-CM | POA: Diagnosis not present

## 2013-01-31 DIAGNOSIS — I10 Essential (primary) hypertension: Secondary | ICD-10-CM | POA: Diagnosis not present

## 2013-01-31 DIAGNOSIS — Z0000 Encounter for general adult medical examination without abnormal findings: Secondary | ICD-10-CM | POA: Diagnosis not present

## 2013-01-31 DIAGNOSIS — Z1331 Encounter for screening for depression: Secondary | ICD-10-CM | POA: Diagnosis not present

## 2013-01-31 DIAGNOSIS — M545 Low back pain: Secondary | ICD-10-CM | POA: Diagnosis not present

## 2013-01-31 DIAGNOSIS — Z79899 Other long term (current) drug therapy: Secondary | ICD-10-CM | POA: Diagnosis not present

## 2013-02-12 DIAGNOSIS — Z79899 Other long term (current) drug therapy: Secondary | ICD-10-CM | POA: Diagnosis not present

## 2013-02-27 DIAGNOSIS — R809 Proteinuria, unspecified: Secondary | ICD-10-CM | POA: Diagnosis not present

## 2013-02-27 DIAGNOSIS — Z79899 Other long term (current) drug therapy: Secondary | ICD-10-CM | POA: Diagnosis not present

## 2013-03-03 DIAGNOSIS — L723 Sebaceous cyst: Secondary | ICD-10-CM | POA: Diagnosis not present

## 2013-09-09 DIAGNOSIS — M961 Postlaminectomy syndrome, not elsewhere classified: Secondary | ICD-10-CM | POA: Diagnosis not present

## 2013-09-09 DIAGNOSIS — M25549 Pain in joints of unspecified hand: Secondary | ICD-10-CM | POA: Diagnosis not present

## 2013-09-09 DIAGNOSIS — M47817 Spondylosis without myelopathy or radiculopathy, lumbosacral region: Secondary | ICD-10-CM | POA: Diagnosis not present

## 2013-09-16 DIAGNOSIS — D0439 Carcinoma in situ of skin of other parts of face: Secondary | ICD-10-CM | POA: Diagnosis not present

## 2013-09-16 DIAGNOSIS — D485 Neoplasm of uncertain behavior of skin: Secondary | ICD-10-CM | POA: Diagnosis not present

## 2013-09-16 DIAGNOSIS — L723 Sebaceous cyst: Secondary | ICD-10-CM | POA: Diagnosis not present

## 2013-09-16 DIAGNOSIS — D0420 Carcinoma in situ of skin of unspecified ear and external auricular canal: Secondary | ICD-10-CM | POA: Diagnosis not present

## 2013-09-16 DIAGNOSIS — L570 Actinic keratosis: Secondary | ICD-10-CM | POA: Diagnosis not present

## 2013-09-16 DIAGNOSIS — Z85828 Personal history of other malignant neoplasm of skin: Secondary | ICD-10-CM | POA: Diagnosis not present

## 2013-09-16 DIAGNOSIS — D0430 Carcinoma in situ of skin of unspecified part of face: Secondary | ICD-10-CM | POA: Diagnosis not present

## 2013-10-03 DIAGNOSIS — Z79899 Other long term (current) drug therapy: Secondary | ICD-10-CM | POA: Diagnosis not present

## 2013-10-03 DIAGNOSIS — R350 Frequency of micturition: Secondary | ICD-10-CM | POA: Diagnosis not present

## 2013-10-03 DIAGNOSIS — G479 Sleep disorder, unspecified: Secondary | ICD-10-CM | POA: Diagnosis not present

## 2013-10-03 DIAGNOSIS — I129 Hypertensive chronic kidney disease with stage 1 through stage 4 chronic kidney disease, or unspecified chronic kidney disease: Secondary | ICD-10-CM | POA: Diagnosis not present

## 2013-10-03 DIAGNOSIS — N183 Chronic kidney disease, stage 3 unspecified: Secondary | ICD-10-CM | POA: Diagnosis not present

## 2013-10-03 DIAGNOSIS — R51 Headache: Secondary | ICD-10-CM | POA: Diagnosis not present

## 2013-10-06 DIAGNOSIS — L905 Scar conditions and fibrosis of skin: Secondary | ICD-10-CM | POA: Diagnosis not present

## 2013-10-06 DIAGNOSIS — D0439 Carcinoma in situ of skin of other parts of face: Secondary | ICD-10-CM | POA: Diagnosis not present

## 2013-10-06 DIAGNOSIS — D0430 Carcinoma in situ of skin of unspecified part of face: Secondary | ICD-10-CM | POA: Diagnosis not present

## 2013-11-03 DIAGNOSIS — Z79899 Other long term (current) drug therapy: Secondary | ICD-10-CM | POA: Diagnosis not present

## 2013-11-03 DIAGNOSIS — I129 Hypertensive chronic kidney disease with stage 1 through stage 4 chronic kidney disease, or unspecified chronic kidney disease: Secondary | ICD-10-CM | POA: Diagnosis not present

## 2013-11-03 DIAGNOSIS — N189 Chronic kidney disease, unspecified: Secondary | ICD-10-CM | POA: Diagnosis not present

## 2013-11-03 DIAGNOSIS — IMO0002 Reserved for concepts with insufficient information to code with codable children: Secondary | ICD-10-CM | POA: Diagnosis not present

## 2013-11-07 DIAGNOSIS — IMO0002 Reserved for concepts with insufficient information to code with codable children: Secondary | ICD-10-CM | POA: Diagnosis not present

## 2013-12-18 DIAGNOSIS — Z1231 Encounter for screening mammogram for malignant neoplasm of breast: Secondary | ICD-10-CM | POA: Diagnosis not present

## 2013-12-18 DIAGNOSIS — N952 Postmenopausal atrophic vaginitis: Secondary | ICD-10-CM | POA: Diagnosis not present

## 2013-12-18 DIAGNOSIS — Z1212 Encounter for screening for malignant neoplasm of rectum: Secondary | ICD-10-CM | POA: Diagnosis not present

## 2013-12-18 DIAGNOSIS — Z01419 Encounter for gynecological examination (general) (routine) without abnormal findings: Secondary | ICD-10-CM | POA: Diagnosis not present

## 2013-12-18 DIAGNOSIS — R32 Unspecified urinary incontinence: Secondary | ICD-10-CM | POA: Diagnosis not present

## 2014-02-03 DIAGNOSIS — Z23 Encounter for immunization: Secondary | ICD-10-CM | POA: Diagnosis not present

## 2014-02-03 DIAGNOSIS — S51809A Unspecified open wound of unspecified forearm, initial encounter: Secondary | ICD-10-CM | POA: Diagnosis not present

## 2014-02-06 DIAGNOSIS — Z79899 Other long term (current) drug therapy: Secondary | ICD-10-CM | POA: Diagnosis not present

## 2014-02-06 DIAGNOSIS — N183 Chronic kidney disease, stage 3 unspecified: Secondary | ICD-10-CM | POA: Diagnosis not present

## 2014-02-06 DIAGNOSIS — I129 Hypertensive chronic kidney disease with stage 1 through stage 4 chronic kidney disease, or unspecified chronic kidney disease: Secondary | ICD-10-CM | POA: Diagnosis not present

## 2014-02-06 DIAGNOSIS — Z1331 Encounter for screening for depression: Secondary | ICD-10-CM | POA: Diagnosis not present

## 2014-02-06 DIAGNOSIS — M810 Age-related osteoporosis without current pathological fracture: Secondary | ICD-10-CM | POA: Diagnosis not present

## 2014-02-06 DIAGNOSIS — Z0000 Encounter for general adult medical examination without abnormal findings: Secondary | ICD-10-CM | POA: Diagnosis not present

## 2014-02-06 DIAGNOSIS — Z23 Encounter for immunization: Secondary | ICD-10-CM | POA: Diagnosis not present

## 2014-02-17 DIAGNOSIS — Z85828 Personal history of other malignant neoplasm of skin: Secondary | ICD-10-CM | POA: Diagnosis not present

## 2014-02-17 DIAGNOSIS — L570 Actinic keratosis: Secondary | ICD-10-CM | POA: Diagnosis not present

## 2014-02-17 DIAGNOSIS — L0293 Carbuncle, unspecified: Secondary | ICD-10-CM | POA: Diagnosis not present

## 2014-02-17 DIAGNOSIS — L0292 Furuncle, unspecified: Secondary | ICD-10-CM | POA: Diagnosis not present

## 2014-02-23 DIAGNOSIS — D72829 Elevated white blood cell count, unspecified: Secondary | ICD-10-CM | POA: Diagnosis not present

## 2014-02-27 ENCOUNTER — Encounter: Payer: Self-pay | Admitting: *Deleted

## 2014-03-16 DIAGNOSIS — N183 Chronic kidney disease, stage 3 (moderate): Secondary | ICD-10-CM | POA: Diagnosis not present

## 2014-03-16 DIAGNOSIS — E559 Vitamin D deficiency, unspecified: Secondary | ICD-10-CM | POA: Diagnosis not present

## 2014-03-16 DIAGNOSIS — I129 Hypertensive chronic kidney disease with stage 1 through stage 4 chronic kidney disease, or unspecified chronic kidney disease: Secondary | ICD-10-CM | POA: Diagnosis not present

## 2014-03-16 DIAGNOSIS — L989 Disorder of the skin and subcutaneous tissue, unspecified: Secondary | ICD-10-CM | POA: Diagnosis not present

## 2014-03-16 DIAGNOSIS — D72829 Elevated white blood cell count, unspecified: Secondary | ICD-10-CM | POA: Diagnosis not present

## 2014-03-16 DIAGNOSIS — R1031 Right lower quadrant pain: Secondary | ICD-10-CM | POA: Diagnosis not present

## 2014-03-16 DIAGNOSIS — Z79899 Other long term (current) drug therapy: Secondary | ICD-10-CM | POA: Diagnosis not present

## 2014-03-18 ENCOUNTER — Other Ambulatory Visit: Payer: Self-pay

## 2014-03-18 DIAGNOSIS — Z85828 Personal history of other malignant neoplasm of skin: Secondary | ICD-10-CM | POA: Diagnosis not present

## 2014-03-18 DIAGNOSIS — C44622 Squamous cell carcinoma of skin of right upper limb, including shoulder: Secondary | ICD-10-CM | POA: Diagnosis not present

## 2014-03-18 DIAGNOSIS — D485 Neoplasm of uncertain behavior of skin: Secondary | ICD-10-CM | POA: Diagnosis not present

## 2014-03-24 ENCOUNTER — Telehealth: Payer: Self-pay | Admitting: Hematology and Oncology

## 2014-03-24 NOTE — Telephone Encounter (Signed)
LEFT MESSAGE FOR PATIENT DTR PIPER TO RETURN CALL TO SCHEDULE NP APPT.

## 2014-03-26 ENCOUNTER — Observation Stay (HOSPITAL_COMMUNITY)
Admission: EM | Admit: 2014-03-26 | Discharge: 2014-03-28 | Disposition: A | Discharge status: Home / Self Care | Payer: Medicare Other | Source: Other Acute Inpatient Hospital | Attending: Internal Medicine | Admitting: Internal Medicine

## 2014-03-26 ENCOUNTER — Encounter (HOSPITAL_COMMUNITY): Payer: Self-pay | Admitting: General Practice

## 2014-03-26 DIAGNOSIS — Z853 Personal history of malignant neoplasm of breast: Secondary | ICD-10-CM | POA: Diagnosis not present

## 2014-03-26 DIAGNOSIS — S60222A Contusion of left hand, initial encounter: Principal | ICD-10-CM | POA: Insufficient documentation

## 2014-03-26 DIAGNOSIS — T1490 Injury, unspecified: Secondary | ICD-10-CM | POA: Diagnosis not present

## 2014-03-26 DIAGNOSIS — R55 Syncope and collapse: Secondary | ICD-10-CM | POA: Diagnosis present

## 2014-03-26 DIAGNOSIS — S0990XA Unspecified injury of head, initial encounter: Secondary | ICD-10-CM | POA: Diagnosis not present

## 2014-03-26 DIAGNOSIS — Y9389 Activity, other specified: Secondary | ICD-10-CM | POA: Diagnosis not present

## 2014-03-26 DIAGNOSIS — I10 Essential (primary) hypertension: Secondary | ICD-10-CM | POA: Diagnosis not present

## 2014-03-26 DIAGNOSIS — M810 Age-related osteoporosis without current pathological fracture: Secondary | ICD-10-CM | POA: Insufficient documentation

## 2014-03-26 DIAGNOSIS — R0902 Hypoxemia: Secondary | ICD-10-CM | POA: Diagnosis not present

## 2014-03-26 DIAGNOSIS — Z9889 Other specified postprocedural states: Secondary | ICD-10-CM | POA: Diagnosis not present

## 2014-03-26 DIAGNOSIS — N281 Cyst of kidney, acquired: Secondary | ICD-10-CM | POA: Diagnosis not present

## 2014-03-26 DIAGNOSIS — Y998 Other external cause status: Secondary | ICD-10-CM | POA: Diagnosis not present

## 2014-03-26 DIAGNOSIS — R7989 Other specified abnormal findings of blood chemistry: Secondary | ICD-10-CM

## 2014-03-26 DIAGNOSIS — E559 Vitamin D deficiency, unspecified: Secondary | ICD-10-CM | POA: Diagnosis not present

## 2014-03-26 DIAGNOSIS — I249 Acute ischemic heart disease, unspecified: Secondary | ICD-10-CM | POA: Diagnosis not present

## 2014-03-26 DIAGNOSIS — I517 Cardiomegaly: Secondary | ICD-10-CM | POA: Diagnosis not present

## 2014-03-26 DIAGNOSIS — T148XXA Other injury of unspecified body region, initial encounter: Secondary | ICD-10-CM | POA: Insufficient documentation

## 2014-03-26 DIAGNOSIS — Y92009 Unspecified place in unspecified non-institutional (private) residence as the place of occurrence of the external cause: Secondary | ICD-10-CM | POA: Diagnosis not present

## 2014-03-26 DIAGNOSIS — Y929 Unspecified place or not applicable: Secondary | ICD-10-CM | POA: Diagnosis not present

## 2014-03-26 DIAGNOSIS — K5730 Diverticulosis of large intestine without perforation or abscess without bleeding: Secondary | ICD-10-CM | POA: Diagnosis not present

## 2014-03-26 DIAGNOSIS — D72829 Elevated white blood cell count, unspecified: Secondary | ICD-10-CM | POA: Insufficient documentation

## 2014-03-26 DIAGNOSIS — Z9011 Acquired absence of right breast and nipple: Secondary | ICD-10-CM | POA: Insufficient documentation

## 2014-03-26 DIAGNOSIS — R778 Other specified abnormalities of plasma proteins: Secondary | ICD-10-CM | POA: Diagnosis present

## 2014-03-26 DIAGNOSIS — Z79899 Other long term (current) drug therapy: Secondary | ICD-10-CM | POA: Diagnosis not present

## 2014-03-26 DIAGNOSIS — W19XXXA Unspecified fall, initial encounter: Secondary | ICD-10-CM | POA: Diagnosis not present

## 2014-03-26 DIAGNOSIS — R0602 Shortness of breath: Secondary | ICD-10-CM | POA: Diagnosis not present

## 2014-03-26 DIAGNOSIS — S6992XA Unspecified injury of left wrist, hand and finger(s), initial encounter: Secondary | ICD-10-CM | POA: Diagnosis present

## 2014-03-26 DIAGNOSIS — W1830XA Fall on same level, unspecified, initial encounter: Secondary | ICD-10-CM | POA: Diagnosis not present

## 2014-03-26 DIAGNOSIS — M5030 Other cervical disc degeneration, unspecified cervical region: Secondary | ICD-10-CM | POA: Diagnosis not present

## 2014-03-26 DIAGNOSIS — C946 Myelodysplastic disease, not classified: Secondary | ICD-10-CM | POA: Insufficient documentation

## 2014-03-26 LAB — CBC WITH DIFFERENTIAL/PLATELET
BASOS ABS: 0.3 10*3/uL — AB (ref 0.0–0.1)
BASOS PCT: 1 % (ref 0–1)
EOS PCT: 0 % (ref 0–5)
Eosinophils Absolute: 0 10*3/uL (ref 0.0–0.7)
HCT: 40.4 % (ref 36.0–46.0)
HEMOGLOBIN: 13.5 g/dL (ref 12.0–15.0)
LYMPHS ABS: 1.8 10*3/uL (ref 0.7–4.0)
LYMPHS PCT: 7 % — AB (ref 12–46)
MCH: 30.6 pg (ref 26.0–34.0)
MCHC: 33.4 g/dL (ref 30.0–36.0)
MCV: 91.6 fL (ref 78.0–100.0)
MONOS PCT: 3 % (ref 3–12)
Monocytes Absolute: 0.8 10*3/uL (ref 0.1–1.0)
Neutro Abs: 23.1 10*3/uL — ABNORMAL HIGH (ref 1.7–7.7)
Neutrophils Relative %: 89 % — ABNORMAL HIGH (ref 43–77)
Platelets: 449 10*3/uL — ABNORMAL HIGH (ref 150–400)
RBC: 4.41 MIL/uL (ref 3.87–5.11)
RDW: 13.7 % (ref 11.5–15.5)
SMEAR REVIEW: INCREASED
WBC: 26 10*3/uL — AB (ref 4.0–10.5)

## 2014-03-26 LAB — LACTIC ACID, PLASMA: LACTIC ACID, VENOUS: 0.9 mmol/L (ref 0.5–2.2)

## 2014-03-26 LAB — COMPREHENSIVE METABOLIC PANEL
ALBUMIN: 3.6 g/dL (ref 3.5–5.2)
ALT: 9 U/L (ref 0–35)
ANION GAP: 16 — AB (ref 5–15)
AST: 20 U/L (ref 0–37)
Alkaline Phosphatase: 54 U/L (ref 39–117)
BILIRUBIN TOTAL: 0.5 mg/dL (ref 0.3–1.2)
BUN: 13 mg/dL (ref 6–23)
CHLORIDE: 97 meq/L (ref 96–112)
CO2: 25 mEq/L (ref 19–32)
CREATININE: 0.81 mg/dL (ref 0.50–1.10)
Calcium: 8.8 mg/dL (ref 8.4–10.5)
GFR calc Af Amer: 77 mL/min — ABNORMAL LOW (ref >90)
GFR calc non Af Amer: 67 mL/min — ABNORMAL LOW (ref >90)
Glucose, Bld: 145 mg/dL — ABNORMAL HIGH (ref 70–99)
POTASSIUM: 3.5 meq/L — AB (ref 3.7–5.3)
Sodium: 138 mEq/L (ref 137–147)
TOTAL PROTEIN: 6.6 g/dL (ref 6.0–8.3)

## 2014-03-26 LAB — TROPONIN I: Troponin I: <0.3 ng/mL (ref <0.30)

## 2014-03-26 MED ORDER — CALCIUM CARBONATE 1250 (500 CA) MG PO TABS
1250.0000 mg | ORAL_TABLET | Freq: Two times a day (BID) | ORAL | Status: DC
  Administered 2014-03-27 – 2014-03-28 (×2): 1250 mg via ORAL
  Filled 2014-03-26: qty 1
  Filled 2014-03-26: qty 3
  Filled 2014-03-26 (×2): qty 1
  Filled 2014-03-26: qty 3
  Filled 2014-03-26: qty 1

## 2014-03-26 MED ORDER — METOPROLOL SUCCINATE ER 25 MG PO TB24
25.0000 mg | ORAL_TABLET | Freq: Every day | ORAL | Status: DC
  Administered 2014-03-27 – 2014-03-28 (×2): 25 mg via ORAL
  Filled 2014-03-26 (×2): qty 1

## 2014-03-26 MED ORDER — VITAMIN D 1000 UNITS PO TABS
1000.0000 [IU] | ORAL_TABLET | Freq: Every day | ORAL | Status: DC
  Administered 2014-03-27 – 2014-03-28 (×2): 1000 [IU] via ORAL
  Filled 2014-03-26: qty 1

## 2014-03-26 MED ORDER — ACETAMINOPHEN 650 MG RE SUPP: 650.0000 mg | Freq: Four times a day (QID) | RECTAL | Status: DC | PRN

## 2014-03-26 MED ORDER — ACETAMINOPHEN 325 MG PO TABS
650.0000 mg | ORAL_TABLET | Freq: Four times a day (QID) | ORAL | Status: DC | PRN
Start: 2014-03-26 — End: 2014-03-28
  Administered 2014-03-26 – 2014-03-27 (×2): 650 mg via ORAL
  Filled 2014-03-26 (×2): qty 2

## 2014-03-26 MED ORDER — ACETAMINOPHEN 325 MG PO TABS: 650.0000 mg | ORAL_TABLET | Freq: Four times a day (QID) | ORAL | Status: DC | PRN

## 2014-03-26 MED ORDER — ONDANSETRON HCL 4 MG/2ML IJ SOLN: 4.0000 mg | Freq: Four times a day (QID) | INTRAMUSCULAR | Status: DC | PRN

## 2014-03-26 MED ORDER — SODIUM CHLORIDE 0.9 % IJ SOLN
3.0000 mL | Freq: Two times a day (BID) | INTRAMUSCULAR | Status: DC
Start: 2014-03-26 — End: 2014-03-28
  Administered 2014-03-26 – 2014-03-27 (×2): 3 mL via INTRAVENOUS

## 2014-03-26 MED ORDER — SODIUM CHLORIDE 0.9 % IV SOLN: INTRAVENOUS | Status: DC

## 2014-03-26 MED ORDER — ASPIRIN-DIPYRIDAMOLE ER 25-200 MG PO CP12
1.0000 | ORAL_CAPSULE | Freq: Two times a day (BID) | ORAL | Status: DC
  Administered 2014-03-26 – 2014-03-28 (×4): 1 via ORAL
  Filled 2014-03-26 (×6): qty 1

## 2014-03-26 MED ORDER — ONDANSETRON HCL 4 MG PO TABS: 4.0000 mg | ORAL_TABLET | Freq: Four times a day (QID) | ORAL | Status: DC | PRN

## 2014-03-26 MED ORDER — GABAPENTIN 100 MG PO CAPS
100.0000 mg | ORAL_CAPSULE | Freq: Three times a day (TID) | ORAL | Status: DC
  Administered 2014-03-26 – 2014-03-28 (×4): 100 mg via ORAL
  Filled 2014-03-26 (×4): qty 1

## 2014-03-26 NOTE — H&P (Signed)
[facility] History and Physical  [name] ASN:[id number] DOB: 11-29-1931 DOA: 03/26/2014  Referring physician: Patient was transferred from [facility]. PCP: Mathews Argyle, MD   HISTORY OBTAINED FROM PATIENT AND PATIENT'S DAUGHTER AND SON.  Chief Complaint: Loss of consciousness.  HPI: [name] is a 80 y.o. female with history of hypertension and osteoporosis and previous history of breast cancer status post right mastectomy was transferred to [facility] after patient was found to have elevated troponin. Patient states that she had a fall today and on noontime. Patient states she does not recall the incident and patient was found on the floor by patient's grandson. Patient was unconscious for around 2-3 minutes. Patient did not have any incontinence of urine or tongue bite. Patient did not have any prodrome before she fell and did not have any chest pain or shortness of breath palpitations diaphoresis nausea vomiting diarrhea. Patient has been having chronic headaches off and on for which she takes pain medications as needed. Patient after losing consciousness was found to be very confused and unable to speak for 15 minutes. In the ER at [facility] patient had the head C-spine chest with contrast to rule out PE and abdomen and pelvis all of which were unremarkable. EKG was showing sinus rhythm with incomplete RBBB and possible LVH with lateral wall ST depression. Labs revealed leukocytosis with elevated troponin but normal CK levels. Patient at this time is of any pain in her left hand where there is a hematoma and mild headache as patient has mild bruising and ecchymosis. Patient's daughter states that she used to be on HCTZ which was discontinued 3 months ago by patient's PCP as patient was found to mildly worsening kidney function. Patient medication list states tramadol but family states patient has not been taking for a long time. Patient  presently is alert awake oriented and moves all extremities.  Review of Systems: As presented in the history of presenting illness, rest negative.  Past Medical History  Diagnosis Date  . Hypertension   . Osteoporosis   . Back pain   . Vitamin D deficiency   . Proteinuria   . Angioedema    Past Surgical History  Procedure Laterality Date  . Breast surgery    . Spine surgery     Social History:  reports that she quit smoking about 5 years ago. She does not have any smokeless tobacco history on file. She reports that she does not drink alcohol or use illicit drugs. Where does patient live home. Can patient participate in ADLs?yes.  Allergies  Allergen Reactions  . Quinapril     Family History:  Family History  Problem Relation Age of Onset  . Family history unknown: Yes      Prior to Admission medications   Medication Sig Start Date End Date Taking? Authorizing Provider  alendronate (FOSAMAX) 70 MG tablet Take 70 mg by mouth once a week. Take with a full glass of water on an empty stomach.    Historical Provider, MD  calcium carbonate 1250 MG capsule Take 1,250 mg by mouth 2 (two) times daily with a meal.    Historical Provider, MD  cholecalciferol (VITAMIN D) 1000 UNITS tablet Take 1,000 Units by mouth daily.    Historical Provider, MD  dipyridamole-aspirin (AGGRENOX) 200-25 MG per 12 hr capsule Take 1 capsule by mouth 2 (two) times daily.    Historical Provider, MD  gabapentin (NEURONTIN) 100 MG capsule Take 100 mg by mouth  3 (three) times daily.    Historical Provider, MD  hydrochlorothiazide (HYDRODIURIL) 50 MG tablet Take 50 mg by mouth daily.    Historical Provider, MD  metoprolol succinate (TOPROL-XL) 25 MG 24 hr tablet Take 25 mg by mouth daily.    Historical Provider, MD  potassium chloride SA (K-DUR,KLOR-CON) 20 MEQ tablet Take 20 mEq by mouth 2 (two) times daily.    Historical Provider, MD  traMADol (ULTRAM) 50 MG tablet Take by mouth every 6 (six) hours as needed.     Historical Provider, MD    Physical Exam: Filed Vitals:   03/26/14 2120 03/26/14 2216  BP: 132/68   Pulse: 88 81  Temp: 98.2 F (36.8 C)   TempSrc: Oral   Resp: 20 20  Height: 5\' 4"  (1.626 m)   Weight: 55 kg (121 lb 4.1 oz)   SpO2: 90% 95%     General:  Moderately built and nourished.  Eyes: anicteric no pallor.  ENT: bruise on the forehead and mild ecchymosis.  Neck: no neck rigidity or mass felt.  Cardiovascular: S1-S2 heard.  Respiratory: no rhonchi or crepitations.  Abdomen: soft nontender bowel sounds present.  Skin: ecchymosis on the forehead and left hand.  Musculoskeletal: left hand hematoma.  Psychiatric: appears normal.  Neurologic: alert awake oriented to time place and person. Moves all extremities 5 x 5. No facial asymmetry tongue is midline. PERRLA positive.  Labs on Admission:  Basic Metabolic Panel: No results for input(s): NA, K, CL, CO2, GLUCOSE, BUN, CREATININE, CALCIUM, MG, PHOS in the last 168 hours. Liver Function Tests: No results for input(s): AST, ALT, ALKPHOS, BILITOT, PROT, ALBUMIN in the last 168 hours. No results for input(s): LIPASE, AMYLASE in the last 168 hours. No results for input(s): AMMONIA in the last 168 hours. CBC: No results for input(s): WBC, NEUTROABS, HGB, HCT, MCV, PLT in the last 168 hours. Cardiac Enzymes: No results for input(s): CKTOTAL, CKMB, CKMBINDEX, TROPONINI in the last 168 hours.  BNP (last 3 results) No results for input(s): PROBNP in the last 8760 hours. CBG: No results for input(s): GLUCAP in the last 168 hours.  Radiological Exams on Admission: No results found.  EKG: Independently reviewed. EKG done at [facility] shows normal sinus rhythm with incomplete RBBB and LVH with possible ST depression in the lateral leads.  Assessment/Plan Principal Problem:   Syncope Active Problems:   Essential hypertension   Elevated troponin   1. Syncope - cause not clear. Closely monitor  in telemetry for arrhythmias given the patient also has elevated troponins. Check 2-D echo. CT [facility] was negative for pulmonary embolism. Since patient also had confusion and difficulty to speak of a fall check EEG and MRI brain. Check orthostatics in a.m. And presently gently hydrate. 2. Elevated troponin - patient is chest pain-free cycle cardiac markers check 2-D echo. Patient is already on Aggrenox which will be continued. 3. Left hand hematoma - closely observe for any further worsening and check x-ray left hand to rule out fracture. 4. Hypertension - continue present medications. 5. Leukocytosis - probably reactionary. Patient is afebrile check UA. CT chest was unremarkable. 6. History of breast cancer status post mastectomy in 1980s. 7. History of old CVA - on Aggrenox.    Code Status: full code.  Family Communication: patient's daughter and son at bedside.  Disposition Plan: admit for observation.    Derry Kassel N. [facility] Pager [phone].  If 7PM-7AM, please contact night-coverage [email or web address] Password [email or web address]  03/26/2014, 10:46 PM

## 2014-03-27 ENCOUNTER — Ambulatory Visit (HOSPITAL_COMMUNITY): Payer: Medicare Other

## 2014-03-27 ENCOUNTER — Observation Stay (HOSPITAL_COMMUNITY): Payer: Medicare Other

## 2014-03-27 DIAGNOSIS — R7989 Other specified abnormal findings of blood chemistry: Secondary | ICD-10-CM | POA: Diagnosis not present

## 2014-03-27 DIAGNOSIS — D72829 Elevated white blood cell count, unspecified: Secondary | ICD-10-CM | POA: Insufficient documentation

## 2014-03-27 DIAGNOSIS — I517 Cardiomegaly: Secondary | ICD-10-CM | POA: Diagnosis not present

## 2014-03-27 DIAGNOSIS — S6992XA Unspecified injury of left wrist, hand and finger(s), initial encounter: Secondary | ICD-10-CM | POA: Diagnosis not present

## 2014-03-27 DIAGNOSIS — S060X9A Concussion with loss of consciousness of unspecified duration, initial encounter: Secondary | ICD-10-CM | POA: Diagnosis not present

## 2014-03-27 DIAGNOSIS — S60222A Contusion of left hand, initial encounter: Secondary | ICD-10-CM | POA: Diagnosis not present

## 2014-03-27 DIAGNOSIS — R55 Syncope and collapse: Secondary | ICD-10-CM | POA: Diagnosis not present

## 2014-03-27 DIAGNOSIS — I10 Essential (primary) hypertension: Secondary | ICD-10-CM | POA: Diagnosis not present

## 2014-03-27 DIAGNOSIS — M79642 Pain in left hand: Secondary | ICD-10-CM | POA: Diagnosis not present

## 2014-03-27 LAB — CBC WITH DIFFERENTIAL/PLATELET
BASOS PCT: 1 % (ref 0–1)
Basophils Absolute: 0.3 10*3/uL — ABNORMAL HIGH (ref 0.0–0.1)
EOS PCT: 0 % (ref 0–5)
Eosinophils Absolute: 0 10*3/uL (ref 0.0–0.7)
HEMATOCRIT: 39.7 % (ref 36.0–46.0)
HEMOGLOBIN: 13.2 g/dL (ref 12.0–15.0)
Lymphocytes Relative: 6 % — ABNORMAL LOW (ref 12–46)
Lymphs Abs: 1.7 10*3/uL (ref 0.7–4.0)
MCH: 30.8 pg (ref 26.0–34.0)
MCHC: 33.2 g/dL (ref 30.0–36.0)
MCV: 92.8 fL (ref 78.0–100.0)
MONOS PCT: 5 % (ref 3–12)
Monocytes Absolute: 1.4 10*3/uL — ABNORMAL HIGH (ref 0.1–1.0)
Neutro Abs: 25.1 10*3/uL — ABNORMAL HIGH (ref 1.7–7.7)
Neutrophils Relative %: 88 % — ABNORMAL HIGH (ref 43–77)
Platelets: 437 10*3/uL — ABNORMAL HIGH (ref 150–400)
RBC: 4.28 MIL/uL (ref 3.87–5.11)
RDW: 13.9 % (ref 11.5–15.5)
WBC: 28.5 10*3/uL — AB (ref 4.0–10.5)

## 2014-03-27 LAB — URINALYSIS, ROUTINE W REFLEX MICROSCOPIC
GLUCOSE, UA: NEGATIVE mg/dL
Ketones, ur: 15 mg/dL — AB
Leukocytes, UA: NEGATIVE
Nitrite: NEGATIVE
Protein, ur: >300 mg/dL — AB
UROBILINOGEN UA: 0.2 mg/dL (ref 0.0–1.0)
pH: 5.5 (ref 5.0–8.0)

## 2014-03-27 LAB — TROPONIN I: Troponin I: <0.3 ng/mL (ref <0.30)

## 2014-03-27 LAB — URINE MICROSCOPIC-ADD ON

## 2014-03-27 LAB — COMPREHENSIVE METABOLIC PANEL
ALBUMIN: 3.5 g/dL (ref 3.5–5.2)
ALK PHOS: 51 U/L (ref 39–117)
ALT: 9 U/L (ref 0–35)
AST: 21 U/L (ref 0–37)
Anion gap: 17 — ABNORMAL HIGH (ref 5–15)
BUN: 14 mg/dL (ref 6–23)
CO2: 24 mEq/L (ref 19–32)
CREATININE: 0.83 mg/dL (ref 0.50–1.10)
Calcium: 8.9 mg/dL (ref 8.4–10.5)
Chloride: 102 mEq/L (ref 96–112)
GFR calc Af Amer: 75 mL/min — ABNORMAL LOW (ref >90)
GFR calc non Af Amer: 65 mL/min — ABNORMAL LOW (ref >90)
Glucose, Bld: 114 mg/dL — ABNORMAL HIGH (ref 70–99)
POTASSIUM: 3.8 meq/L (ref 3.7–5.3)
Sodium: 143 mEq/L (ref 137–147)
TOTAL PROTEIN: 6.4 g/dL (ref 6.0–8.3)
Total Bilirubin: 0.4 mg/dL (ref 0.3–1.2)

## 2014-03-27 LAB — SAVE SMEAR

## 2014-03-27 LAB — MRSA PCR SCREENING: MRSA BY PCR: NEGATIVE

## 2014-03-27 MED ORDER — METOCLOPRAMIDE HCL 5 MG/ML IJ SOLN
10.0000 mg | Freq: Once | INTRAMUSCULAR | Status: AC
  Administered 2014-03-27: 10 mg via INTRAVENOUS
  Filled 2014-03-27: qty 2

## 2014-03-27 MED ORDER — KETOROLAC TROMETHAMINE 15 MG/ML IJ SOLN
15.0000 mg | Freq: Once | INTRAMUSCULAR | Status: AC
  Administered 2014-03-27: 15 mg via INTRAVENOUS
  Filled 2014-03-27: qty 1

## 2014-03-27 MED ORDER — DIPHENHYDRAMINE HCL 50 MG/ML IJ SOLN
25.0000 mg | Freq: Once | INTRAMUSCULAR | Status: AC
  Administered 2014-03-27: 25 mg via INTRAVENOUS
  Filled 2014-03-27: qty 1

## 2014-03-27 NOTE — Evaluation (Signed)
Physical Therapy Evaluation Patient Details Name: [name] MRN: [id number] DOB: Nov 18, 1931 Today's Date: 03/27/2014   History of Present Illness  Pt admitted after being found down on the floor unresponsive.  Work up progressing.  MRI negative  Clinical Impression  Pt admitted with/for syncope of unknown origin.  Pt currently limited functionally due to the problems listed below.  (see problems list.)  Pt will benefit from PT to maximize function and safety to be able to get home safely with available assist of family.     Follow Up Recommendations Outpatient PT;Supervision - Intermittent;Other (comment) (up to 24 hour assist)    Equipment Recommendations  None recommended by PT    Recommendations for Other Services       Precautions / Restrictions Precautions Precautions: Fall      Mobility  Bed Mobility Overal bed mobility: Needs Assistance Bed Mobility: Supine to Sit     Supine to sit: Supervision     General bed mobility comments: struggle to EOB without use of rail  Transfers Overall transfer level: Needs assistance   Transfers: Sit to/from Stand Sit to Stand: Supervision            Ambulation/Gait Ambulation/Gait assistance: Min assist;Min guard Ambulation Distance (Feet): 120 Feet Assistive device: None Gait Pattern/deviations: Step-through pattern Gait velocity: slow Gait velocity interpretation: Below normal speed for age/gender General Gait Details: mildly unsteady overall with medium guard of UE's.  Mild staggery steps to try to maintain balance.  Occasional use of the rail.  Stairs            Wheelchair Mobility    Modified Rankin (Stroke Patients Only)       Balance Overall balance assessment: Needs assistance Sitting-balance support: No upper extremity supported Sitting balance-Leahy Scale: Fair     Standing balance support: Bilateral upper extremity supported Standing balance-Leahy Scale: Fair                               Pertinent Vitals/Pain Pain Assessment: No/denies pain    Home Living Family/patient expects to be discharged to:: Private residence Living Arrangements: Other relatives (grandson stays with her at night) Available Help at Discharge: Family;Available PRN/intermittently;Other (Comment) (Family will provide adequate care up to 24/7) Type of Home: House       Home Layout: One level Home Equipment: Cane - single point;Bedside commode;Walker - 2 wheels      Prior Function Level of Independence: Independent with assistive device(s)               Hand Dominance        Extremity/Trunk Assessment   Upper Extremity Assessment: Generalized weakness (bilaterally)           Lower Extremity Assessment: Overall WFL for tasks assessed (proximal weakness bilaterally and lower trunk)      Cervical / Trunk Assessment: Normal  Communication   Communication: No difficulties  Cognition Arousal/Alertness: Awake/alert Behavior During Therapy: WFL for tasks assessed/performed Overall Cognitive Status: Within Functional Limits for tasks assessed                      General Comments      Exercises        Assessment/Plan    PT Assessment Patient needs continued PT services  PT Diagnosis Abnormality of gait;Generalized weakness   PT Problem List Decreased strength;Decreased activity tolerance;Decreased balance;Decreased mobility;Decreased knowledge of use of DME  PT Treatment Interventions  DME instruction;Gait training;Stair training;Functional mobility training;Therapeutic activities;Patient/family education;Balance training;Therapeutic exercise   PT Goals (Current goals can be found in the Care Plan section) Acute Rehab PT Goals Patient Stated Goal: get back home PT Goal Formulation: With patient Time For Goal Achievement: 04/03/14 Potential to Achieve Goals: Good    Frequency Min 3X/week   Barriers to discharge        Co-evaluation                End of Session   Activity Tolerance: Patient tolerated treatment well;Patient limited by fatigue Patient left: in bed;with family/visitor present;with call bell/phone within reach (sitting EOB) Nurse Communication: Mobility status    Functional Assessment Tool Used: clinical judgement Functional Limitation: Mobility: Walking and moving around Mobility: Walking and Moving Around Current Status ([id number]): At least 1 percent but less than 20 percent impaired, limited or restricted Mobility: Walking and Moving Around Goal Status [phone]): At least 1 percent but less than 20 percent impaired, limited or restricted    Time: 2876-8115 PT Time Calculation (min): 28 min   Charges:   PT Evaluation $Initial PT Evaluation Tier I: 1 Procedure PT Treatments $Gait Training: 8-22 mins   PT G Codes:   Functional Assessment Tool Used: clinical judgement Functional Limitation: Mobility: Walking and moving around    Carolyn Maniscalco, Tessie Fass 03/27/2014, 5:26 PM 03/27/2014  Donnella Sham, [place] [phone]  (pager)

## 2014-03-27 NOTE — Progress Notes (Signed)
  Echocardiogram 2D Echocardiogram has been performed.  Diamond Nickel 03/27/2014, 12:21 PM

## 2014-03-27 NOTE — Procedures (Signed)
ELECTROENCEPHALOGRAM REPORT  Patient: [name]       Room #: 8M57 EEG No. ID: [id number] Age: 80 y.o.        Sex: female Referring Physician: Curly Shores Report Date:  03/27/2014        Interpreting Physician: Anthony Sar  History: [name] is an 80 y.o. female admitted following an episode of loss of consciousness followed by confusion. Patient has no memory for this event.  Indications for study:  Rule out new onset seizure disorder.  Technique: This is an 18 channel routine scalp EEG performed at the bedside with bipolar and monopolar montages arranged in accordance to the international 10/20 system of electrode placement.   Description: This EEG recording was performed during wakefulness and during sleep. Predominant background activity during wakefulness consisted of 8 Hz symmetrical alpha rhythm. Photic stimulation produced symmetrical occipital driving response. Hyperventilation was not performed. There was slowing of background activity symmetrically with irregular mixed delta and theta activity during sleep. Symmetrical sleep spindles, vertex waves and arousal responses were recorded during stage II of sleep. No epileptiform discharges were seen during wakefulness nor during sleep.  Interpretation: This is a normal EEG recording during wakefulness and during sleep. No evidence of an epileptic disorder was demonstrated.   Rush Farmer M.D. [facility] [phone]

## 2014-03-27 NOTE — Progress Notes (Signed)
[facility]                                                                              Patient Demographics  [name], is a 80 y.o. female, DOB - March 18, 1932, GYJ:[id number]  Admit date - 03/26/2014   Admitting Physician Cristal Ford, DO  Outpatient Primary MD for the patient is Mathews Argyle, MD  LOS - 1   No chief complaint on file.     HPI on 03/26/2014 by Dr. Truett Mainland is a 80 y.o. female with history of hypertension and osteoporosis and previous history of breast cancer status post right mastectomy was transferred to [facility] after patient was found to have elevated troponin. Patient states that she had a fall today and on noontime. Patient states she does not recall the incident and patient was found on the floor by patient's grandson. Patient was unconscious for around 2-3 minutes. Patient did not have any incontinence of urine or tongue bite. Patient did not have any prodrome before she fell and did not have any chest pain or shortness of breath palpitations diaphoresis nausea vomiting diarrhea. Patient has been having chronic headaches off and on for which she takes pain medications as needed. Patient after losing consciousness was found to be very confused and unable to speak for 15 minutes. In the ER at [facility] patient had the head C-spine chest with contrast to rule out PE and abdomen and pelvis all of which were unremarkable. EKG was showing sinus rhythm with incomplete RBBB and possible LVH with lateral wall ST depression. Labs revealed leukocytosis with elevated troponin but normal CK levels. Patient at this time is of any pain in her left hand where there is a hematoma and mild headache as patient has mild bruising and ecchymosis. Patient's daughter states that she used to be on HCTZ which was discontinued 3 months ago by patient's PCP as patient was found to mildly worsening kidney function. Patient medication list states  tramadol but family states patient has not been taking for a long time. Patient presently is alert awake oriented and moves all extremities.  Assessment & Plan   Syncopal episode with fall -Etiology unclear at this time -Continue telemetry monitoring for arrhythmias -EEG and Echocardiogram pending -orthostatic vitals negative -possibly related to hypoxia, as patient was hypoxic in [place] ER -Last nursing to evaluate saturation on room air  Elevated troponin -Patient had elevated troponin while in [facility] emergency department. Troponins have been cycled and are negative 3  Left hand hematoma/forehead hematoma -Xray: scapholunate separation, severe radiocarpal and first carpometacarpal degenerative changes. SLAC wrist cannot be excluded, MRI would be useful for further evaluation -We'll order MRI left hand  Hypertension -stable, continue metoprolol  Leukocytosis -Spoke with patient's daughter regarding her white count, per daughter patient has had elevated white count for approximately 4 months. -Patient currently afebrile with no source of infection -CT of the chest as well as x-ray done in [place] were unremarkable -Urinalysis was unremarkable for infection -Possibly reactive -Hematology consulted appreciated  History of breast cancer status post mastectomy -Occurred in 1980s  History of CVA -Continue Aggrenox  Code Status: full  Family Communication: daughter at  bedside  Disposition Plan: admitted for observation, pending further workup  Time Spent in minutes   30 minutes  Procedures  Echocardiogram EEG  Consults   Hematology oncology  DVT Prophylaxis  SCDs  Lab Results  Component Value Date   PLT 437* 03/27/2014    Medications  Scheduled Meds: . calcium carbonate  1,250 mg Oral BID WC  . cholecalciferol  1,000 Units Oral Daily  . dipyridamole-aspirin  1 capsule Oral BID  . gabapentin  100 mg Oral TID  . metoprolol succinate  25 mg Oral Daily  .  sodium chloride  3 mL Intravenous Q12H   Continuous Infusions: . sodium chloride     PRN Meds:.[DISCONTINUED] acetaminophen **OR** acetaminophen, acetaminophen, ondansetron **OR** ondansetron (ZOFRAN) IV  Antibiotics    Anti-infectives    None        Subjective:   [name] seen and examined today.  Patient does not recall the events that occurred yesterday. Currently she denies any chest pain or palpitations or shortness of breath, recent illnesses, diarrhea.  Objective:   Filed Vitals:   03/27/14 0000 03/27/14 0400 03/27/14 0615 03/27/14 0801  BP: 137/70  121/59 145/64  Pulse: 84  71 82  Temp: 98.2 F (36.8 C) 97.9 F (36.6 C) 97.9 F (36.6 C)   TempSrc: Axillary Axillary Axillary   Resp: 22  16 15   Height:      Weight:   54.988 kg (121 lb 3.6 oz)   SpO2: 92%  99%     Wt Readings from Last 3 Encounters:  03/27/14 54.988 kg (121 lb 3.6 oz)  03/27/14 54.885 kg (121 lb)     Intake/Output Summary (Last 24 hours) at 03/27/14 1341 Last data filed at 03/27/14 3016  Gross per 24 hour  Intake    360 ml  Output    400 ml  Net    -40 ml    Exam  General: Well developed, well nourished, NAD, appears stated age  41: [place], forehead bruising and ecchymosis, PERRLA, EOMI, Anicteic Sclera, mucous membranes moist.   Cardiovascular: S1 S2 auscultated, no rubs, murmurs or gallops. Regular rate and rhythm.  Respiratory: Clear to auscultation bilaterally with equal chest rise  Abdomen: Soft, nontender, nondistended, + bowel sounds  Extremities: warm dry without cyanosis clubbing or edema, left hand hematoma with bruising and ecchymosis  Neuro: AAOx3, cranial nerves grossly intact. Strength equal and bilateral in upper/lower ext  Psych: appropriate mood and affect  Data Review   Micro Results Recent Results (from the past 240 hour(s))  MRSA PCR Screening     Status: None   Collection Time: 03/26/14 10:06 PM  Result Value Ref Range Status   MRSA by PCR  NEGATIVE NEGATIVE Final    Comment:        The GeneXpert MRSA Assay (FDA approved for NASAL specimens only), is one component of a comprehensive MRSA colonization surveillance program. It is not intended to diagnose MRSA infection nor to guide or monitor treatment for MRSA infections.     Radiology Reports Mr Brain Wo Contrast  03/27/2014   CLINICAL DATA:  Syncope and hematoma. Fall with loss of consciousness. Patient has no recollection of the incident and was found on the floor by a family member. Subsequent confusion following the fall with inability to speak for 15 min. Initial encounter.  EXAM: MRI HEAD WITHOUT CONTRAST  TECHNIQUE: Multiplanar, multiecho pulse sequences of the brain and surrounding structures were obtained without intravenous contrast.  COMPARISON:  08/06/2012  FINDINGS:  There is no evidence of acute infarct, intracranial hemorrhage, mass, midline shift, or extra-axial fluid collection. There is moderate cerebral atrophy, not significantly changed. Patchy and confluent T2 hyperintensities throughout the subcortical and deep cerebral white matter bilaterally are similar to the prior study and nonspecific but compatible with moderate chronic small vessel ischemic disease. Small, chronic left cerebellar infarcts are noted.  Prior bilateral cataract extraction is noted. Paranasal sinuses and mastoid air cells are clear. Major intracranial vascular flow voids are preserved. Visualized upper cervical spine demonstrates moderate spondylosis with unchanged reversal of the normal cervical lordosis there is likely mild to moderate spinal stenosis at C2-3.  IMPRESSION: 1. No acute intracranial abnormality. 2. Moderate chronic small vessel ischemic disease and cerebral atrophy.   Electronically Signed   By: Logan Bores   On: 03/27/2014 09:47   Dg Hand Complete Left  03/27/2014   CLINICAL DATA:  Painful left hand following fall. Initial evaluation.  EXAM: LEFT HAND - COMPLETE 3+ VIEW   COMPARISON:  None.  FINDINGS: Prominent scapholunate separation noted. No evidence of fracture dislocation. Severe radiocarpal and first carpometacarpal degenerative change present.SLAC wrist cannot be excluded.  IMPRESSION: 1. Scapholunate separation. Severe radiocarpal and first carpometacarpal degenerative change. SLAC wrist cannot be excluded and MRI may prove useful for further evaluation.   Electronically Signed   By: Marcello Moores  Register   On: 03/27/2014 09:55    CBC  Recent Labs Lab 03/26/14 2228 03/27/14 0323  WBC 26.0* 28.5*  HGB 13.5 13.2  HCT 40.4 39.7  PLT 449* 437*  MCV 91.6 92.8  MCH 30.6 30.8  MCHC 33.4 33.2  RDW 13.7 13.9  LYMPHSABS 1.8 1.7  MONOABS 0.8 1.4*  EOSABS 0.0 0.0  BASOSABS 0.3* 0.3*    Chemistries   Recent Labs Lab 03/26/14 2228 03/27/14 0323  NA 138 143  K 3.5* 3.8  CL 97 102  CO2 25 24  GLUCOSE 145* 114*  BUN 13 14  CREATININE 0.81 0.83  CALCIUM 8.8 8.9  AST 20 21  ALT 9 9  ALKPHOS 54 51  BILITOT 0.5 0.4   ------------------------------------------------------------------------------------------------------------------ estimated creatinine clearance is 46.7 mL/min (by C-G formula based on Cr of 0.83). ------------------------------------------------------------------------------------------------------------------ No results for input(s): HGBA1C in the last 72 hours. ------------------------------------------------------------------------------------------------------------------ No results for input(s): CHOL, HDL, LDLCALC, TRIG, CHOLHDL, LDLDIRECT in the last 72 hours. ------------------------------------------------------------------------------------------------------------------ No results for input(s): TSH, T4TOTAL, T3FREE, THYROIDAB in the last 72 hours.  Invalid input(s): FREET3 ------------------------------------------------------------------------------------------------------------------ No results for input(s): VITAMINB12,  FOLATE, FERRITIN, TIBC, IRON, RETICCTPCT in the last 72 hours.  Coagulation profile No results for input(s): INR, PROTIME in the last 168 hours.  No results for input(s): DDIMER in the last 72 hours.  Cardiac Enzymes  Recent Labs Lab 03/26/14 2228 03/27/14 0323 03/27/14 0920  TROPONINI <0.30 <0.30 <0.30   ------------------------------------------------------------------------------------------------------------------ Invalid input(s): POCBNP    Lauriana Denes D.O. on 03/27/2014 at 1:41 PM  Between 7am to 7pm - Pager - [phone]  After 7pm go to [email or web address] - password [email or web address]  And look for the night coverage person covering for me after hours  [facility] Office  [phone]

## 2014-03-27 NOTE — Progress Notes (Signed)
EEG Completed; Results Pending  

## 2014-03-27 NOTE — Progress Notes (Signed)
UR completed 

## 2014-03-27 NOTE — Consult Note (Signed)
[facility]  Telephone:([phone]) [place] CONSULTATION NOTE  [name]                                MR#: [id number]  DOB: 25-Mar-1932                       CSN#: [id number]  Referring MD: Dr. [facility]   Patient Care Team: Lajean Manes, MD as PCP - General (Internal Medicine)  Reason for Consult: Leukocytosis  XAJ:OINOMVEH [name] is a 80 y.o. female  with a history of breast cancer s/p left mastectomy in the 1986 without chemo or radiation, transferred from [facility] after being admitted due to a fall with loss of consciousness, left hand hematoma. She was confused after the event.  Head CT, CT of the  C-spine and CT of the chest, abdomen and pelvis with contrast were negative for acute findings. EKG was in sinus rhythm with incomplete RBBB and possible LVH with lateral wall ST depression. Her Troponins were elevated but with normal CK. 2 D echo pending  She was found to have abnormal CBC in [place] showing leukocytosis.Her WBC was 17.54 with normal H/H and platelets. ANC was 13.26.  Monocytes 1.16. Immature granulocytes 0.11.  Apparently, she was to be seen at the [facility] by Oncology, referred by Dr. Felipa Eth, as it appears that Leukocytosis may have been present for about 4 months.   Denies fevers, chills or abnormal night sweats.She denies recent infection. There is not reported symptoms of sinus congestion, cough, urinary frequency/urgency or dysuria, diarrhea, joint swelling/pain or abnormal skin rash. Her age appropriate screening programs are up-to-date.The patient has no prior diagnosis of autoimmune disease and was not prescribed corticosteroids related products. The patient quit smoking 5 years ago. Patient denies any history or family history of leukemia or other bone marrow disorders. Never had a bone marrow biopsy in the past. UA is pending. Creatinine and Calcium were normal. Denies risk for Hepatitis or HIV.   Her  WBC on admission to [facility] rose to 26  with ANC  25.1on 11/6 and to 28.5 today with ANC 23.1        PMH:  Past Medical History  Diagnosis Date  . Hypertension   . Osteoporosis   . Back pain   . Vitamin D deficiency   . Proteinuria   . Angioedema   History of old CVA on Aggrenox  Surgeries:  Past Surgical History  Procedure Laterality Date   s/p left mastectomy 1986    . Status post neurofibroma resection C7-T1 for cord  impingement/myelopathy with subsequent C5-T2 decompression/fusion  February 25, 2008.  2009   s/p hysterectomy s/p appendectomy   Allergies:  Allergies  Allergen Reactions  . Quinapril     Medications:   Prior to Admission:  Prescriptions prior to admission  Medication Sig Dispense Refill Last Dose  . alendronate (FOSAMAX) 70 MG tablet Take 70 mg by mouth once a week. Take with a full glass of water on an empty stomach.     . calcium carbonate 1250 MG capsule Take 1,250 mg by mouth 2 (two) times daily with a meal.     . cholecalciferol (VITAMIN D) 1000 UNITS tablet Take 1,000 Units by mouth daily.     Marland Kitchen dipyridamole-aspirin (AGGRENOX) 200-25 MG per 12 hr capsule Take 1 capsule by mouth 2 (  two) times daily.     Marland Kitchen gabapentin (NEURONTIN) 100 MG capsule Take 100 mg by mouth 3 (three) times daily.     . hydrochlorothiazide (HYDRODIURIL) 50 MG tablet Take 50 mg by mouth daily.     . metoprolol succinate (TOPROL-XL) 25 MG 24 hr tablet Take 25 mg by mouth daily.     . potassium chloride SA (K-DUR,KLOR-CON) 20 MEQ tablet Take 20 mEq by mouth 2 (two) times daily.     . traMADol (ULTRAM) 50 MG tablet Take by mouth every 6 (six) hours as needed.      Scheduled Meds: . calcium carbonate  1,250 mg Oral BID WC  . cholecalciferol  1,000 Units Oral Daily  . dipyridamole-aspirin  1 capsule Oral BID  . gabapentin  100 mg Oral TID  . metoprolol succinate  25 mg Oral Daily  . sodium chloride  3 mL Intravenous Q12H   Continuous Infusions: . sodium  chloride     PRN Meds:.[DISCONTINUED] acetaminophen **OR** acetaminophen, acetaminophen, ondansetron **OR** ondansetron (ZOFRAN) IV   ROS: Constitutional: Denies fevers, chills or abnormal night sweats Eyes: Denies blurriness of vision, double vision or watery eyes Ears, nose, mouth, throat, and face: Denies mucositis or sore throat Respiratory: Denies cough, dyspnea or wheezes Cardiovascular: Denies palpitation, chest discomfort or lower extremity swelling Gastrointestinal:  Denies nausea, heartburn or change in bowel habits Skin: Denies abnormal skin rashes Lymphatics: Denies new lymphadenopathy or easy bruising Neurological:Denies numbness, tingling or new weaknesses Behavioral/Psych: Mood is stable, no new changes  All other systems were reviewed with the patient and are negative.e   Family History:    Family History  Problem Relation Age of Onset  . Family history unknown: Yes    No family history of hematological  disorders.  SOCIAL HISTORY: The patient is widowed, lives alone. 2 children. The patient does not use any tobacco or alcohol  Physical Exam    ECOG PERFORMANCE STATUS:   Filed Vitals:   03/27/14 0801  BP: 145/64  Pulse: 82  Temp:   Resp: 15   Filed Weights   03/26/14 2120 03/27/14 0615  Weight: 121 lb 4.1 oz (55 kg) 121 lb 3.6 oz (54.988 kg)    GENERAL:alert, no distress and comfortable SKIN: skin color, texture, turgor are normal, some bruising on left hand and forefead EYES: normal, conjunctiva are pink and non-injected, sclera clear OROPHARYNX:no exudate, no erythema and lips, buccal mucosa, and tongue normal  NECK: supple, thyroid normal size, non-tender, without nodularity LYMPH:  no palpable lymphadenopathy in the cervical, axillary or inguinal LUNGS: clear to auscultation and percussion with normal breathing effort HEART: regular rate & rhythm and no murmurs and no lower extremity edema ABDOMEN:abdomen soft, non-tender and normal bowel  sounds Musculoskeletal:no cyanosis of digits and no clubbing . No chest wall masses or tenderness PSYCH: alert & oriented x 3 with fluent speech NEURO: no focal motor/sensory deficits   Labs:  CBC   Recent Labs Lab 03/26/14 2228 03/27/14 0323  WBC 26.0* 28.5*  HGB 13.5 13.2  HCT 40.4 39.7  PLT 449* 437*  MCV 91.6 92.8  MCH 30.6 30.8  MCHC 33.4 33.2  RDW 13.7 13.9  LYMPHSABS 1.8 1.7  MONOABS 0.8 1.4*  EOSABS 0.0 0.0  BASOSABS 0.3* 0.3*     CMP    Recent Labs Lab 03/26/14 2228 03/27/14 0323  NA 138 143  K 3.5* 3.8  CL 97 102  CO2 25 24  GLUCOSE 145* 114*  BUN 13 14  CREATININE  0.81 0.83  CALCIUM 8.8 8.9  AST 20 21  ALT 9 9  ALKPHOS 54 51  BILITOT 0.5 0.4        Component Value Date/Time   BILITOT 0.4 03/27/2014 0323     No results for input(s): INR, PROTIME in the last 168 hours.  No results for input(s): DDIMER in the last 72 hours.   Anemia panel:  No results for input(s): VITAMINB12, FOLATE, FERRITIN, TIBC, IRON, RETICCTPCT in the last 72 hours.   Imaging Studies:  Mr Brain Wo Contrast  03/27/2014   CLINICAL DATA:  Syncope and hematoma. Fall with loss of consciousness. Patient has no recollection of the incident and was found on the floor by a family member. Subsequent confusion following the fall with inability to speak for 15 min. Initial encounter.  EXAM: MRI HEAD WITHOUT CONTRAST  TECHNIQUE: Multiplanar, multiecho pulse sequences of the brain and surrounding structures were obtained without intravenous contrast.  COMPARISON:  08/06/2012  FINDINGS: There is no evidence of acute infarct, intracranial hemorrhage, mass, midline shift, or extra-axial fluid collection. There is moderate cerebral atrophy, not significantly changed. Patchy and confluent T2 hyperintensities throughout the subcortical and deep cerebral white matter bilaterally are similar to the prior study and nonspecific but compatible with moderate chronic small vessel ischemic  disease. Small, chronic left cerebellar infarcts are noted.  Prior bilateral cataract extraction is noted. Paranasal sinuses and mastoid air cells are clear. Major intracranial vascular flow voids are preserved. Visualized upper cervical spine demonstrates moderate spondylosis with unchanged reversal of the normal cervical lordosis there is likely mild to moderate spinal stenosis at C2-3.  IMPRESSION: 1. No acute intracranial abnormality. 2. Moderate chronic small vessel ischemic disease and cerebral atrophy.   Electronically Signed   By: Logan Bores   On: 03/27/2014 09:47   Dg Hand Complete Left  03/27/2014   CLINICAL DATA:  Painful left hand following fall. Initial evaluation.  EXAM: LEFT HAND - COMPLETE 3+ VIEW  COMPARISON:  None.  FINDINGS: Prominent scapholunate separation noted. No evidence of fracture dislocation. Severe radiocarpal and first carpometacarpal degenerative change present.SLAC wrist cannot be excluded.  IMPRESSION: 1. Scapholunate separation. Severe radiocarpal and first carpometacarpal degenerative change. SLAC wrist cannot be excluded and MRI may prove useful for further evaluation.   Electronically Signed   By: Marcello Moores  Register   On: 03/27/2014 09:55       Assessment/Plan: 80 y.o. female admitted with   Leukocytosis Likely reactive, but peripheral smear ordered to rule out any underlying process The patient was to be seen at the cancer center prior to this admission, as she had elevated white counts since  August  of 2015, Ranging between 14 and 16,000. These evaluation was to be taking place sometime this week. However, As she was admitted, hematologist consultation was requested. Obtain prior records for lab evaluation other medical iisues as per admitting team.   Rondel Jumbo, PA-C 03/27/2014 2:09 PM  ADDENDUM:  I saw and examined the patient. I agree with the above by Clarise Cruz.I get her blood smear. The white cells appear pretty much mature. I do not see any immature  myeloid cells. She has several hyper segmented polys. I didn't do not see any blasts. She had no atypical lymphocytes. Her red cells showed no nucleated red cells were teardrop cells. She had no rouleau formation. There were no spherocytes. Her platelets showed several large platelets. The platelets were well granulated.  It is hard to say whether or not there is a  myeloproliferative disorder. By the platelet morphology, one might think that she could have a myeloproliferative disorder.  I would check her iron studies. I would check a JAK2 assay. I would also consider sending off a Calreticulin assay.  I don't think that she has chronic myeloid leukemia.  A ultrasound of the spleen might be helpful.  I would consider a bone marrow biopsy as a test of last resort.  I will have to try to get hold of her family doctor to try to get some lab results on her over the past several months. It seems as if her leukocytosis has been noted over the past 4 months.  Her daughter was with her. I had a nice time with them. I answered all their questions.  It might be that he could take another 2 or 3 weeks before we really have an idea as to her blood issue. It would be nice if everything stabilized after her fall to see what her blood counts are.  We will follow along and try to help out as much as possible.  Lum Keas

## 2014-03-28 ENCOUNTER — Observation Stay (HOSPITAL_COMMUNITY): Payer: Medicare Other

## 2014-03-28 ENCOUNTER — Encounter (HOSPITAL_COMMUNITY): Payer: Self-pay | Admitting: *Deleted

## 2014-03-28 DIAGNOSIS — T148 Other injury of unspecified body region: Secondary | ICD-10-CM | POA: Diagnosis not present

## 2014-03-28 DIAGNOSIS — S60222A Contusion of left hand, initial encounter: Secondary | ICD-10-CM | POA: Diagnosis not present

## 2014-03-28 DIAGNOSIS — T148XXA Other injury of unspecified body region, initial encounter: Secondary | ICD-10-CM | POA: Insufficient documentation

## 2014-03-28 DIAGNOSIS — D72829 Elevated white blood cell count, unspecified: Secondary | ICD-10-CM | POA: Diagnosis not present

## 2014-03-28 DIAGNOSIS — R55 Syncope and collapse: Secondary | ICD-10-CM | POA: Insufficient documentation

## 2014-03-28 DIAGNOSIS — R7989 Other specified abnormal findings of blood chemistry: Secondary | ICD-10-CM | POA: Diagnosis not present

## 2014-03-28 DIAGNOSIS — I10 Essential (primary) hypertension: Secondary | ICD-10-CM | POA: Diagnosis not present

## 2014-03-28 LAB — CBC
HCT: 39.1 % (ref 36.0–46.0)
HEMOGLOBIN: 12.9 g/dL (ref 12.0–15.0)
MCH: 30.5 pg (ref 26.0–34.0)
MCHC: 33 g/dL (ref 30.0–36.0)
MCV: 92.4 fL (ref 78.0–100.0)
Platelets: 450 10*3/uL — ABNORMAL HIGH (ref 150–400)
RBC: 4.23 MIL/uL (ref 3.87–5.11)
RDW: 14.1 % (ref 11.5–15.5)
WBC: 20.5 10*3/uL — ABNORMAL HIGH (ref 4.0–10.5)

## 2014-03-28 LAB — LACTATE DEHYDROGENASE: LDH: 299 U/L — ABNORMAL HIGH (ref 94–250)

## 2014-03-28 LAB — PROTIME-INR
INR: 1.08 (ref 0.00–1.49)
PROTHROMBIN TIME: 14.1 s (ref 11.6–15.2)

## 2014-03-28 LAB — APTT: aPTT: 37 seconds (ref 24–37)

## 2014-03-28 NOTE — Discharge Instructions (Signed)
Syncope °Syncope is a medical term for fainting or passing out. This means you lose consciousness and drop to the ground. People are generally unconscious for less than 5 minutes. You may have some muscle twitches for up to 15 seconds before waking up and returning to normal. Syncope occurs more often in older adults, but it can happen to anyone. While most causes of syncope are not dangerous, syncope can be a sign of a serious medical problem. It is important to seek medical care.  °CAUSES  °Syncope is caused by a sudden drop in blood flow to the brain. The specific cause is often not determined. Factors that can bring on syncope include: °· Taking medicines that lower blood pressure. °· Sudden changes in posture, such as standing up quickly. °· Taking more medicine than prescribed. °· Standing in one place for too long. °· Seizure disorders. °· Dehydration and excessive exposure to heat. °· Low blood sugar (hypoglycemia). °· Straining to have a bowel movement. °· Heart disease, irregular heartbeat, or other circulatory problems. °· Fear, emotional distress, seeing blood, or severe pain. °SYMPTOMS  °Right before fainting, you may: °· Feel dizzy or light-headed. °· Feel nauseous. °· See all white or all black in your field of vision. °· Have cold, clammy skin. °DIAGNOSIS  °Your health care provider will ask about your symptoms, perform a physical exam, and perform an electrocardiogram (ECG) to record the electrical activity of your heart. Your health care provider may also perform other heart or blood tests to determine the cause of your syncope which may include: °· Transthoracic echocardiogram (TTE). During echocardiography, sound waves are used to evaluate how blood flows through your heart. °· Transesophageal echocardiogram (TEE). °· Cardiac monitoring. This allows your health care provider to monitor your heart rate and rhythm in real time. °· Holter monitor. This is a portable device that records your  heartbeat and can help diagnose heart arrhythmias. It allows your health care provider to track your heart activity for several days, if needed. °· Stress tests by exercise or by giving medicine that makes the heart beat faster. °TREATMENT  °In most cases, no treatment is needed. Depending on the cause of your syncope, your health care provider may recommend changing or stopping some of your medicines. °HOME CARE INSTRUCTIONS °· Have someone stay with you until you feel stable. °· Do not drive, use machinery, or play sports until your health care provider says it is okay. °· Keep all follow-up appointments as directed by your health care provider. °· Lie down right away if you start feeling like you might faint. Breathe deeply and steadily. Wait until all the symptoms have passed. °· Drink enough fluids to keep your urine clear or pale yellow. °· If you are taking blood pressure or heart medicine, get up slowly and take several minutes to sit and then stand. This can reduce dizziness. °SEEK IMMEDIATE MEDICAL CARE IF:  °· You have a severe headache. °· You have unusual pain in the chest, abdomen, or back. °· You are bleeding from your mouth or rectum, or you have black or tarry stool. °· You have an irregular or very fast heartbeat. °· You have pain with breathing. °· You have repeated fainting or seizure-like jerking during an episode. °· You faint when sitting or lying down. °· You have confusion. °· You have trouble walking. °· You have severe weakness. °· You have vision problems. °If you fainted, call your local emergency services (911 in [place] Do not drive   yourself to the hospital.  °MAKE SURE YOU: °· Understand these instructions. °· Will watch your condition. °· Will get help right away if you are not doing well or get worse. °Document Released: 05/08/2005 Document Revised: 05/13/2013 Document Reviewed: 07/07/2011 °ExitCare® Patient Information ©2015 [employer]. This information is not intended to replace  advice given to you by your health care provider. Make sure you discuss any questions you have with your health care provider. ° °

## 2014-03-28 NOTE — Plan of Care (Signed)
Problem: Discharge Progression Outcomes Goal: Discharge plan in place and appropriate Outcome: Completed/Met Date Met:  03/28/14 Goal: Pain controlled with appropriate interventions Outcome: Completed/Met Date Met:  03/28/14 Goal: Hemodynamically stable Outcome: Completed/Met Date Met:  38/68/54 Goal: Complications resolved/controlled Outcome: Completed/Met Date Met:  03/28/14 Goal: Tolerating diet Outcome: Completed/Met Date Met:  03/28/14 Goal: Activity appropriate for discharge plan Outcome: Completed/Met Date Met:  03/28/14 Goal: Other Discharge Outcomes/Goals Outcome: Completed/Met Date Met:  03/28/14

## 2014-03-28 NOTE — Progress Notes (Signed)
SATURATION QUALIFICATIONS: (This note is used to comply with regulatory documentation for home oxygen)  Patient Saturations on Room Air at Rest = 90%  Patient Saturations on Room Air while Ambulating = 87%  Patient Saturations on 2 Liters of oxygen while Ambulating =92%  Please briefly explain why patient needs home oxygen: Pt O2 saturation is dropping to below 88% with ambulation, mild dyspnea noted with ambulation, pt denies dizziness.

## 2014-03-28 NOTE — Discharge Summary (Signed)
Physician Discharge Summary  [name] KXF:[id number] DOB: Nov 25, 1931 DOA: 03/26/2014  PCP: Mathews Argyle, MD  Admit date: 03/26/2014 Discharge date: 03/28/2014  Time spent: 45 minutes  Recommendations for Outpatient Follow-up:  Patient will be discharged home. She is to continue physical therapy as an outpatient. Patient will be discharged with oxygen. Patient is to continue taking her medications as prescribed. She should follow-up with her primary care physician within one week of discharge. Patient should also follow-up with Dr. Marin Olp within 2 weeks of discharge.  Patient should follow a heart healthy diet.  Discharge Diagnoses:  Principal Problem:   Syncope Active Problems:   Essential hypertension   Elevated troponin   Leukocytosis   Hematoma   Faintness   Discharge Condition: Stable  Diet recommendation: Heart healthy  Filed Weights   03/26/14 2120 03/27/14 0615 03/28/14 0452  Weight: 55 kg (121 lb 4.1 oz) 54.988 kg (121 lb 3.6 oz) 54.789 kg (120 lb 12.6 oz)    History of present illness:  on 03/26/2014 by Dr. Truett Mainland is a 80 y.o. female with history of hypertension and osteoporosis and previous history of breast cancer status post right mastectomy was transferred to [facility] after patient was found to have elevated troponin. Patient states that she had a fall today and on noontime. Patient states she does not recall the incident and patient was found on the floor by patient's grandson. Patient was unconscious for around 2-3 minutes. Patient did not have any incontinence of urine or tongue bite. Patient did not have any prodrome before she fell and did not have any chest pain or shortness of breath palpitations diaphoresis nausea vomiting diarrhea. Patient has been having chronic headaches off and on for which she takes pain medications as needed. Patient after losing consciousness was found to be very confused and unable to speak  for 15 minutes. In the ER at [facility] patient had the head C-spine chest with contrast to rule out PE and abdomen and pelvis all of which were unremarkable. EKG was showing sinus rhythm with incomplete RBBB and possible LVH with lateral wall ST depression. Labs revealed leukocytosis with elevated troponin but normal CK levels. Patient at this time is of any pain in her left hand where there is a hematoma and mild headache as patient has mild bruising and ecchymosis. Patient's daughter states that she used to be on HCTZ which was discontinued 3 months ago by patient's PCP as patient was found to mildly worsening kidney function. Patient medication list states tramadol but family states patient has not been taking for a long time. Patient presently is alert awake oriented and moves all extremities.  Hospital Course:  Syncopal episode with fall -Etiology unclear at this time -possibly related to hypoxia, as patient was hypoxic in [place] ER -telemetry monitoring showed no arrhythmias -orthostatic vitals negative -echocardiogram: EF 69-67%, grade 1 diastolic dysfunction -EEG: normal EEG -PT consulted for evaluation and recommended outpatient physical therapy -patient did the saturate while ambulating to 87%, however on 2 L saturations rose to 92% -Patient will be discharged with home oxygen  Elevated troponin -Patient had elevated troponin while in [facility] emergency department. Troponins have been cycled and are negative 3  Left hand hematoma/forehead hematoma -Xray: scapholunate separation, severe radiocarpal and first carpometacarpal degenerative changes. SLAC wrist cannot be excluded, MRI would be useful for further evaluation -could not obtain MRIs patient's daughter stated they would follow-up if needed -Patient is able to grip with her left  hand. -Recommended patient see orthopedic surgery as an outpatient if any changes.  Hypertension -stable, continue  metoprolol  Leukocytosis -Spoke with patient's daughter regarding her white count, per daughter patient has had elevated white count for approximately 4 months. -Patient currently afebrile with no source of infection -CT of the chest as well as x-ray done in [place] were unremarkable -Urinalysis was unremarkable for infection -Possibly reactive -Hematology consulted appreciated and recommended ultrasound of the abdomen, peripheral smear, Barnabas Lister 2 assay, iron studies, possiblebone marrow biopsy at a later time -Patient should follow-up with Dr. Marin Olp at discharge   History of breast cancer status post mastectomy -Occurred in 1980s  History of CVA -Continue Aggrenox  Procedures: Ultrasound the abdomen EEG Echocardiogram  Consultations: Hematology oncology  Discharge Exam: Filed Vitals:   03/28/14 0802  BP: 176/92  Pulse: 75  Temp:   Resp: 17     General: Well developed, well nourished, NAD, appears stated age  HEENT: [place], forehead bruising and ecchymosis,mucous membranes moist.  Cardiovascular: S1 S2 auscultated, no rubs, murmurs or gallops. Regular rate and rhythm.  Respiratory: Clear to auscultation bilaterally with equal chest rise  Abdomen: Soft, nontender, nondistended, + bowel sounds  Extremities: warm dry without cyanosis clubbing or edema, left hand hematoma with bruising and ecchymosis  Neuro: AAOx3, no focal deficits  Skin: Without rashes exudates or nodules  Psych: appropriate mood and affect  Discharge Instructions      Discharge Instructions    Discharge instructions    Complete by:  As directed   Patient will be discharged home. She is to continue physical therapy as an outpatient. Patient will be discharged with oxygen. Patient is to continue taking her medications as prescribed. She should follow-up with her primary care physician within one week of discharge. Patient should also follow-up with Dr. Marin Olp within 2 weeks of discharge.  Patient  should follow a heart healthy diet.            Medication List    STOP taking these medications        cholecalciferol 1000 UNITS tablet  Commonly known as:  VITAMIN D      TAKE these medications        AGGRENOX 200-25 MG per 12 hr capsule  Generic drug:  dipyridamole-aspirin  Take 1 capsule by mouth 2 (two) times daily.     alendronate 70 MG tablet  Commonly known as:  FOSAMAX  Take 70 mg by mouth once a week. Take with a full glass of water on an empty stomach.     calcium carbonate 1250 MG capsule  Take 1,250 mg by mouth daily.     gabapentin 100 MG capsule  Commonly known as:  NEURONTIN  Take 100 mg by mouth at bedtime.     metoprolol succinate 25 MG 24 hr tablet  Commonly known as:  TOPROL-XL  Take 25 mg by mouth daily.     traMADol 50 MG tablet  Commonly known as:  ULTRAM  Take 50 mg by mouth every 6 (six) hours as needed for moderate pain.       Allergies  Allergen Reactions  . Quinapril     unknown   Follow-up Information    Follow up with [facility].   Why:  home oxygen   Contact information:   [address] [place] [address] [phone]        The results of significant diagnostics from this hospitalization (including imaging, microbiology, ancillary and  laboratory) are listed below for reference.    Significant Diagnostic Studies: Mr Brain Wo Contrast  03/27/2014   CLINICAL DATA:  Syncope and hematoma. Fall with loss of consciousness. Patient has no recollection of the incident and was found on the floor by a family member. Subsequent confusion following the fall with inability to speak for 15 min. Initial encounter.  EXAM: MRI HEAD WITHOUT CONTRAST  TECHNIQUE: Multiplanar, multiecho pulse sequences of the brain and surrounding structures were obtained without intravenous contrast.  COMPARISON:  08/06/2012  FINDINGS: There is no evidence of acute infarct, intracranial hemorrhage, mass, midline shift, or extra-axial  fluid collection. There is moderate cerebral atrophy, not significantly changed. Patchy and confluent T2 hyperintensities throughout the subcortical and deep cerebral white matter bilaterally are similar to the prior study and nonspecific but compatible with moderate chronic small vessel ischemic disease. Small, chronic left cerebellar infarcts are noted.  Prior bilateral cataract extraction is noted. Paranasal sinuses and mastoid air cells are clear. Major intracranial vascular flow voids are preserved. Visualized upper cervical spine demonstrates moderate spondylosis with unchanged reversal of the normal cervical lordosis there is likely mild to moderate spinal stenosis at C2-3.  IMPRESSION: 1. No acute intracranial abnormality. 2. Moderate chronic small vessel ischemic disease and cerebral atrophy.   Electronically Signed   By: Logan Bores   On: 03/27/2014 09:47   US Abdomen Limited  03/28/2014   CLINICAL DATA:  80 year old female with leukocytosis. Evaluate for splenomegaly  EXAM: LIMITED ABDOMINAL ULTRASOUND  COMPARISON:  Abdominal radiograph 03/03/2008  FINDINGS: Limited sonographic evaluation of the abdomen to assess the spleen. The spleen measures 11.4 cm in largest dimension which is within normal limits. Incidentally, small right and left pleural effusions are noted.  IMPRESSION: 1. Normal splenic volume. 2. Small bilateral pleural effusions incidentally noted.   Electronically Signed   By: Jacqulynn Cadet M.D.   On: 03/28/2014 07:37   Dg Hand Complete Left  03/27/2014   CLINICAL DATA:  Painful left hand following fall. Initial evaluation.  EXAM: LEFT HAND - COMPLETE 3+ VIEW  COMPARISON:  None.  FINDINGS: Prominent scapholunate separation noted. No evidence of fracture dislocation. Severe radiocarpal and first carpometacarpal degenerative change present.SLAC wrist cannot be excluded.  IMPRESSION: 1. Scapholunate separation. Severe radiocarpal and first carpometacarpal degenerative change. SLAC  wrist cannot be excluded and MRI may prove useful for further evaluation.   Electronically Signed   By: Marcello Moores  Register   On: 03/27/2014 09:55    Microbiology: Recent Results (from the past 240 hour(s))  MRSA PCR Screening     Status: None   Collection Time: 03/26/14 10:06 PM  Result Value Ref Range Status   MRSA by PCR NEGATIVE NEGATIVE Final    Comment:        The GeneXpert MRSA Assay (FDA approved for NASAL specimens only), is one component of a comprehensive MRSA colonization surveillance program. It is not intended to diagnose MRSA infection nor to guide or monitor treatment for MRSA infections.      Labs: Basic Metabolic Panel:  Recent Labs Lab 03/26/14 2228 03/27/14 0323  NA 138 143  K 3.5* 3.8  CL 97 102  CO2 25 24  GLUCOSE 145* 114*  BUN 13 14  CREATININE 0.81 0.83  CALCIUM 8.8 8.9   Liver Function Tests:  Recent Labs Lab 03/26/14 2228 03/27/14 0323  AST 20 21  ALT 9 9  ALKPHOS 54 51  BILITOT 0.5 0.4  PROT 6.6 6.4  ALBUMIN 3.6 3.5  No results for input(s): LIPASE, AMYLASE in the last 168 hours. No results for input(s): AMMONIA in the last 168 hours. CBC:  Recent Labs Lab 03/26/14 2228 03/27/14 0323 03/28/14 0409  WBC 26.0* 28.5* 20.5*  NEUTROABS 23.1* 25.1*  --   HGB 13.5 13.2 12.9  HCT 40.4 39.7 39.1  MCV 91.6 92.8 92.4  PLT 449* 437* 450*   Cardiac Enzymes:  Recent Labs Lab 03/26/14 2228 03/27/14 0323 03/27/14 0920 03/27/14 1526  TROPONINI <0.30 <0.30 <0.30 <0.30   BNP: BNP (last 3 results) No results for input(s): PROBNP in the last 8760 hours. CBG: No results for input(s): GLUCAP in the last 168 hours.     SignedCristal Ford  [facility] 03/28/2014, 10:06 AM

## 2014-03-28 NOTE — Care Management Note (Signed)
    Page 1 of 2   03/28/2014     5:03:18 PM CARE MANAGEMENT NOTE 03/28/2014  Patient:  [name], [name]   Account Number:  [id number]  Date Initiated:  03/28/2014  Documentation initiated by:  [facility]  Subjective/Objective Assessment:   adm: an episode of loss of consciousness followed by confusion. Patient has no memory for this event.     Action/Plan:   discharge planning   Anticipated DC Date:  03/28/2014   Anticipated DC Plan:  [place]  CM consult      [facility] Choice  DURABLE MEDICAL EQUIPMENT   Choice offered to / List presented to:  C-1 Patient   DME arranged  [facility]      DME agency  [place].        Status of service:  Completed, signed off Medicare Important Message given?   (If response is "NO", the following Medicare IM given date fields will be blank) Date Medicare IM given:   Medicare IM given by:   Date Additional Medicare IM given:   Additional Medicare IM given by:    Discharge Disposition:  [place]  Per UR Regulation:    If discussed at Long Length of Stay Meetings, dates discussed:    Comments:  03/28/14 16:45 CM spoke with [place] who states [facility] has finally called and they are coming to the house to set up the home oxygen.  No other CM needs were communicated. Mariane Masters, BSN, CM [phone].  1500:  CM called [facility] to see if they have heard from [facility] and she states, "Not as of yet."  CM called DME rep Jeneen Rinks and Jeneen Rinks states he will take care of situation.  14:00 Cm received call from RN who states [facility] has not heard from [facility] and when she called them to notify the pt is home, [facility] states they do not have the order.  CM called [facility] DME rep, Jeneen Rinks who states he will rectify.  09:00 CM received call from MD to please arrange for home oxygen for pt discharging today.  CC spoke with daughter of pt, [place], [phone] to arrange.  CM called DME rep, Jeneen Rinks to please deliver O2  to room prior to discharge.  No other CM needs were communicateded.  Mariane Masters, BSN, CM [phone].

## 2014-03-28 NOTE — Progress Notes (Signed)
UR completed 

## 2014-03-30 ENCOUNTER — Telehealth: Payer: Self-pay | Admitting: Hematology & Oncology

## 2014-03-30 LAB — IRON AND TIBC
Iron: 69 ug/dL (ref 42–135)
SATURATION RATIOS: 23 % (ref 20–55)
TIBC: 298 ug/dL (ref 250–470)
UIBC: 229 ug/dL (ref 125–400)

## 2014-03-30 LAB — FERRITIN: Ferritin: 54 ng/mL (ref 10–291)

## 2014-03-30 NOTE — Telephone Encounter (Signed)
Pt family called to schedule appointment. They are aware she had appointment with Dr. Alvy Bimler, they say Dr. Marin Olp in the hospital and are aware of 11-16 appointment here and that there will be a wait between lab and MD appointments

## 2014-04-02 LAB — JAK2 GENOTYPR: JAK2 GENOTYPR: DETECTED — AB

## 2014-04-03 ENCOUNTER — Other Ambulatory Visit: Payer: Self-pay | Admitting: *Deleted

## 2014-04-03 DIAGNOSIS — T671XXD Heat syncope, subsequent encounter: Secondary | ICD-10-CM | POA: Insufficient documentation

## 2014-04-03 DIAGNOSIS — D72829 Elevated white blood cell count, unspecified: Secondary | ICD-10-CM

## 2014-04-03 DIAGNOSIS — I10 Essential (primary) hypertension: Secondary | ICD-10-CM

## 2014-04-06 ENCOUNTER — Other Ambulatory Visit (HOSPITAL_BASED_OUTPATIENT_CLINIC_OR_DEPARTMENT_OTHER): Payer: Medicare Other | Admitting: Lab

## 2014-04-06 ENCOUNTER — Encounter: Payer: Self-pay | Admitting: Hematology & Oncology

## 2014-04-06 ENCOUNTER — Ambulatory Visit: Payer: Medicare Other

## 2014-04-06 ENCOUNTER — Ambulatory Visit: Payer: Medicare Other | Admitting: Hematology and Oncology

## 2014-04-06 ENCOUNTER — Ambulatory Visit (HOSPITAL_BASED_OUTPATIENT_CLINIC_OR_DEPARTMENT_OTHER): Payer: Medicare Other | Admitting: Hematology & Oncology

## 2014-04-06 VITALS — BP 185/83 | HR 53 | Temp 97.5°F | Resp 16 | Ht 64.0 in | Wt 122.0 lb

## 2014-04-06 DIAGNOSIS — D72829 Elevated white blood cell count, unspecified: Secondary | ICD-10-CM

## 2014-04-06 DIAGNOSIS — D473 Essential (hemorrhagic) thrombocythemia: Secondary | ICD-10-CM | POA: Diagnosis not present

## 2014-04-06 DIAGNOSIS — I10 Essential (primary) hypertension: Secondary | ICD-10-CM

## 2014-04-06 LAB — COMPREHENSIVE METABOLIC PANEL
ALK PHOS: 49 U/L (ref 39–117)
ALT: 8 U/L (ref 0–35)
AST: 18 U/L (ref 0–37)
Albumin: 3.9 g/dL (ref 3.5–5.2)
BUN: 20 mg/dL (ref 6–23)
CO2: 27 meq/L (ref 19–32)
CREATININE: 0.94 mg/dL (ref 0.50–1.10)
Calcium: 10.1 mg/dL (ref 8.4–10.5)
Chloride: 103 mEq/L (ref 96–112)
GLUCOSE: 93 mg/dL (ref 70–99)
Potassium: 4.7 mEq/L (ref 3.5–5.3)
Sodium: 142 mEq/L (ref 135–145)
Total Bilirubin: 0.5 mg/dL (ref 0.2–1.2)
Total Protein: 6.7 g/dL (ref 6.0–8.3)

## 2014-04-06 LAB — PROTIME-INR (CHCC SATELLITE)
INR: 1 — ABNORMAL LOW (ref 2.0–3.5)
Protime: 12 Seconds (ref 10.6–13.4)

## 2014-04-06 LAB — CBC WITH DIFFERENTIAL (CANCER CENTER ONLY)
BASO#: 0.1 10*3/uL (ref 0.0–0.2)
BASO%: 0.6 % (ref 0.0–2.0)
EOS ABS: 0.4 10*3/uL (ref 0.0–0.5)
EOS%: 2.7 % (ref 0.0–7.0)
HCT: 41.7 % (ref 34.8–46.6)
HGB: 13.3 g/dL (ref 11.6–15.9)
LYMPH#: 2.4 10*3/uL (ref 0.9–3.3)
LYMPH%: 14.4 % (ref 14.0–48.0)
MCH: 31 pg (ref 26.0–34.0)
MCHC: 31.9 g/dL — ABNORMAL LOW (ref 32.0–36.0)
MCV: 97 fL (ref 81–101)
MONO#: 1.6 10*3/uL — AB (ref 0.1–0.9)
MONO%: 9.7 % (ref 0.0–13.0)
NEUT#: 11.9 10*3/uL — ABNORMAL HIGH (ref 1.5–6.5)
NEUT%: 72.6 % (ref 39.6–80.0)
Platelets: 516 10*3/uL — ABNORMAL HIGH (ref 145–400)
RBC: 4.29 10*6/uL (ref 3.70–5.32)
RDW: 14.1 % (ref 11.1–15.7)
WBC: 16.4 10*3/uL — AB (ref 3.9–10.0)

## 2014-04-06 LAB — TECHNOLOGIST REVIEW CHCC SATELLITE: Tech Review: 3

## 2014-04-06 LAB — LACTATE DEHYDROGENASE: LDH: 350 U/L — AB (ref 94–250)

## 2014-04-06 NOTE — Progress Notes (Signed)
Hematology and Oncology Follow Up Visit  [name] [id number] Mar 29, 1932 80 y.o. 04/06/2014   Principle Diagnosis:   Myeloproliferative neoplasm-JAK2 positive  Current Therapy:    observation     Interim History:  Ms.  Nielsen is back for her first office visit. I saw her in the hospital about 10 days ago. She did come in with a fall. However, she had been noted to have an elevated white cell count and platelet count for several months.  In the hospital, we did a JAK2 assay and this was POSITIVE.  We did a ultrasound of her abdomen in the hospital, and there is no splenomegaly. There is no hepatomegaly.  She currently is at back at home. She is doing okay. Her son-in-law came with her today.  She is off aspirin. I think this is a good idea. She still has some bruising. There's been no bleeding.  She is in a wheelchair. Her strength still needs to get back up to her standard. This might take some time.  Her appetite is doing good. She's had no nausea or vomiting.  There's been no headache. She's had no pain in her hands or feet. she's had no change in bowel or bladder habits.  Overall, her performance status is ECOG 3  Medications: Current outpatient prescriptions: alendronate (FOSAMAX) 70 MG tablet, Take 70 mg by mouth once a week. Take with a full glass of water on an empty stomach., Disp: , Rfl: ;  calcium carbonate 1250 MG capsule, Take 1,250 mg by mouth 2 (two) times daily with a meal. , Disp: , Rfl: ;  Cholecalciferol (VITAMIN D-3) 5000 UNITS TABS, Take by mouth daily after supper., Disp: , Rfl:  dipyridamole-aspirin (AGGRENOX) 200-25 MG per 12 hr capsule, Take 1 capsule by mouth 2 (two) times daily., Disp: , Rfl: ;  gabapentin (NEURONTIN) 100 MG capsule, Take 100 mg by mouth at bedtime. , Disp: , Rfl: ;  metoprolol succinate (TOPROL-XL) 25 MG 24 hr tablet, Take 25 mg by mouth daily., Disp: , Rfl: ;  traMADol (ULTRAM) 50 MG tablet, Take 50 mg by mouth as needed for  moderate pain. , Disp: , Rfl:   Allergies:  Allergies  Allergen Reactions  . Quinapril     unknown    Past Medical History, Surgical history, Social history, and Family History were reviewed and updated.  Review of Systems: As above  Physical Exam:  height is 5\' 4"  (1.626 m) and weight is 122 lb (55.339 kg). Her oral temperature is 97.5 F (36.4 C). Her blood pressure is 185/83 and her pulse is 53. Her respiration is 16 and oxygen saturation is 95%.   Elderly, petite white female in no obvious distress. Head and neck exam shows no ocular or oral lesions. She has no palpable cervical or supraclavicular lymph nodes. Lungs are clear. Cardiac exam regular rate and rhythm with no murmurs, rubs or bruits. Abdomen is soft. She has decent bowel sounds. There is no fluid wave. There is no guarding or rebound tenderness. There is no palpable liver or spleen tip.extremities shows age related osteoarthritic changes. Back exam shows some osteoporotic changes.skin exam shows some scattered ecchymoses. Neurological exam is nonfocal.  Lab Results  Component Value Date   WBC 16.4* 04/06/2014   HGB 13.3 04/06/2014   HCT 41.7 04/06/2014   MCV 97 04/06/2014   PLT 516* 04/06/2014     Chemistry      Component Value Date/Time   NA 143 03/27/2014 0323  K 3.8 03/27/2014 0323   CL 102 03/27/2014 0323   CO2 24 03/27/2014 0323   BUN 14 03/27/2014 0323   CREATININE 0.83 03/27/2014 0323      Component Value Date/Time   CALCIUM 8.9 03/27/2014 0323   ALKPHOS 51 03/27/2014 0323   AST 21 03/27/2014 0323   ALT 9 03/27/2014 0323   BILITOT 0.4 03/27/2014 0323         Impression and Plan: [name] is 80 year old white female. She is JAK2 positive. She has mild leukocytosis and thrombocytosis. Her white cell count is actually much better than it was when she was hospitalized.  I would get her blood smear. I do not see any immature myeloid forms. She has several large platelets.. She has several  hypersegmented polys. There are no atypical lymphocytes. There are no nucleated red cells.  I suspect that she does have a myeloproliferative neoplasm. I talked to her and her son about this. I suspect that her bone marrow is hyperactive. However, I don't think we have to treat her right now.  I think it is a good idea for her to be off the aspirin.  I want to see her back in about one month. I will send off a  von Willebrand panel on her. This will let us determine if she can get back on to the aspirin.  I spent about 40-45 minutes with her. I answered all of her questions. I answered her son-in-law's questions.  [name], MD 11/16/20156:25 PM

## 2014-04-10 DIAGNOSIS — R2689 Other abnormalities of gait and mobility: Secondary | ICD-10-CM | POA: Diagnosis not present

## 2014-04-10 DIAGNOSIS — W19XXXD Unspecified fall, subsequent encounter: Secondary | ICD-10-CM | POA: Diagnosis not present

## 2014-04-14 DIAGNOSIS — R2689 Other abnormalities of gait and mobility: Secondary | ICD-10-CM | POA: Diagnosis not present

## 2014-04-14 DIAGNOSIS — W19XXXD Unspecified fall, subsequent encounter: Secondary | ICD-10-CM | POA: Diagnosis not present

## 2014-04-17 DIAGNOSIS — W19XXXD Unspecified fall, subsequent encounter: Secondary | ICD-10-CM | POA: Diagnosis not present

## 2014-04-17 DIAGNOSIS — R2689 Other abnormalities of gait and mobility: Secondary | ICD-10-CM | POA: Diagnosis not present

## 2014-04-21 DIAGNOSIS — W19XXXD Unspecified fall, subsequent encounter: Secondary | ICD-10-CM | POA: Diagnosis not present

## 2014-04-21 DIAGNOSIS — R2689 Other abnormalities of gait and mobility: Secondary | ICD-10-CM | POA: Diagnosis not present

## 2014-04-28 DIAGNOSIS — R55 Syncope and collapse: Secondary | ICD-10-CM | POA: Diagnosis not present

## 2014-04-28 DIAGNOSIS — R0902 Hypoxemia: Secondary | ICD-10-CM | POA: Diagnosis not present

## 2014-05-01 DIAGNOSIS — W19XXXD Unspecified fall, subsequent encounter: Secondary | ICD-10-CM | POA: Diagnosis not present

## 2014-05-01 DIAGNOSIS — R2689 Other abnormalities of gait and mobility: Secondary | ICD-10-CM | POA: Diagnosis not present

## 2014-05-02 ENCOUNTER — Encounter (HOSPITAL_COMMUNITY): Payer: Self-pay | Admitting: Emergency Medicine

## 2014-05-02 DIAGNOSIS — Z7982 Long term (current) use of aspirin: Secondary | ICD-10-CM | POA: Insufficient documentation

## 2014-05-02 DIAGNOSIS — M810 Age-related osteoporosis without current pathological fracture: Secondary | ICD-10-CM | POA: Diagnosis not present

## 2014-05-02 DIAGNOSIS — Z87891 Personal history of nicotine dependence: Secondary | ICD-10-CM | POA: Diagnosis not present

## 2014-05-02 DIAGNOSIS — Z79899 Other long term (current) drug therapy: Secondary | ICD-10-CM | POA: Insufficient documentation

## 2014-05-02 DIAGNOSIS — R51 Headache: Secondary | ICD-10-CM | POA: Diagnosis not present

## 2014-05-02 DIAGNOSIS — I10 Essential (primary) hypertension: Secondary | ICD-10-CM | POA: Insufficient documentation

## 2014-05-02 DIAGNOSIS — I6782 Cerebral ischemia: Secondary | ICD-10-CM | POA: Diagnosis not present

## 2014-05-02 NOTE — ED Notes (Signed)
Pt. reports elevated blood pressure at home today 219/110 with nausea and headache .

## 2014-05-03 ENCOUNTER — Emergency Department (HOSPITAL_COMMUNITY): Payer: Medicare Other

## 2014-05-03 ENCOUNTER — Emergency Department (HOSPITAL_COMMUNITY)
Admission: EM | Admit: 2014-05-03 | Discharge: 2014-05-03 | Disposition: A | Discharge status: Home / Self Care | Payer: Medicare Other | Attending: Emergency Medicine | Admitting: Emergency Medicine

## 2014-05-03 DIAGNOSIS — R519 Headache, unspecified: Secondary | ICD-10-CM

## 2014-05-03 DIAGNOSIS — I6782 Cerebral ischemia: Secondary | ICD-10-CM | POA: Diagnosis not present

## 2014-05-03 DIAGNOSIS — I10 Essential (primary) hypertension: Secondary | ICD-10-CM

## 2014-05-03 DIAGNOSIS — R51 Headache: Secondary | ICD-10-CM

## 2014-05-03 LAB — COMPREHENSIVE METABOLIC PANEL
ALBUMIN: 3.8 g/dL (ref 3.5–5.2)
ALT: 10 U/L (ref 0–35)
AST: 20 U/L (ref 0–37)
Alkaline Phosphatase: 52 U/L (ref 39–117)
Anion gap: 15 (ref 5–15)
BILIRUBIN TOTAL: 0.5 mg/dL (ref 0.3–1.2)
BUN: 15 mg/dL (ref 6–23)
CHLORIDE: 97 meq/L (ref 96–112)
CO2: 25 mEq/L (ref 19–32)
CREATININE: 0.72 mg/dL (ref 0.50–1.10)
Calcium: 9.7 mg/dL (ref 8.4–10.5)
GFR calc Af Amer: >90 mL/min (ref >90)
GFR calc non Af Amer: 79 mL/min — ABNORMAL LOW (ref >90)
Glucose, Bld: 146 mg/dL — ABNORMAL HIGH (ref 70–99)
Potassium: 3.6 mEq/L — ABNORMAL LOW (ref 3.7–5.3)
SODIUM: 137 meq/L (ref 137–147)
Total Protein: 7 g/dL (ref 6.0–8.3)

## 2014-05-03 LAB — CBC WITH DIFFERENTIAL/PLATELET
BASOS ABS: 0.2 10*3/uL — AB (ref 0.0–0.1)
BASOS PCT: 1 % (ref 0–1)
Eosinophils Absolute: 0.2 10*3/uL (ref 0.0–0.7)
Eosinophils Relative: 2 % (ref 0–5)
HCT: 42.1 % (ref 36.0–46.0)
Hemoglobin: 14 g/dL (ref 12.0–15.0)
Lymphocytes Relative: 12 % (ref 12–46)
Lymphs Abs: 1.7 10*3/uL (ref 0.7–4.0)
MCH: 31 pg (ref 26.0–34.0)
MCHC: 33.3 g/dL (ref 30.0–36.0)
MCV: 93.1 fL (ref 78.0–100.0)
MONO ABS: 0.9 10*3/uL (ref 0.1–1.0)
Monocytes Relative: 7 % (ref 3–12)
Neutro Abs: 11.3 10*3/uL — ABNORMAL HIGH (ref 1.7–7.7)
Neutrophils Relative %: 78 % — ABNORMAL HIGH (ref 43–77)
PLATELETS: 526 10*3/uL — AB (ref 150–400)
RBC: 4.52 MIL/uL (ref 3.87–5.11)
RDW: 13.9 % (ref 11.5–15.5)
WBC: 14.2 10*3/uL — ABNORMAL HIGH (ref 4.0–10.5)

## 2014-05-03 MED ORDER — HYDROCODONE-ACETAMINOPHEN 5-325 MG PO TABS
1.0000 | ORAL_TABLET | Freq: Once | ORAL | Status: AC
  Administered 2014-05-03: 1 via ORAL
  Filled 2014-05-03: qty 1

## 2014-05-03 MED ORDER — HYDROCODONE-ACETAMINOPHEN 5-325 MG PO TABS: 1.0000 | ORAL_TABLET | Freq: Once | ORAL | Status: DC

## 2014-05-03 MED ORDER — HYDROCODONE-ACETAMINOPHEN 5-325 MG PO TABS: 1.0000 | ORAL_TABLET | ORAL | Status: DC | PRN

## 2014-05-03 NOTE — Discharge Instructions (Signed)
Your workup today does not show any specific cause for your headaches. Please follow up with your primary care doctor and/or neurology for further workup. Your blood pressure, while elevated at home, has improved here in the emergency department. Please continue taking your medications as prescribed.    General Headache Without Cause A headache is pain or discomfort felt around the head or neck area. The specific cause of a headache may not be found. There are many causes and types of headaches. A few common ones are:  Tension headaches.  Migraine headaches.  Cluster headaches.  Chronic daily headaches. HOME CARE INSTRUCTIONS   Keep all follow-up appointments with your caregiver or any specialist referral.  Only take over-the-counter or prescription medicines for pain or discomfort as directed by your caregiver.  Lie down in a dark, quiet room when you have a headache.  Keep a headache journal to find out what may trigger your migraine headaches. For example, write down:  What you eat and drink.  How much sleep you get.  Any change to your diet or medicines.  Try massage or other relaxation techniques.  Put ice packs or heat on the head and neck. Use these 3 to 4 times per day for 15 to 20 minutes each time, or as needed.  Limit stress.  Sit up straight, and do not tense your muscles.  Quit smoking if you smoke.  Limit alcohol use.  Decrease the amount of caffeine you drink, or stop drinking caffeine.  Eat and sleep on a regular schedule.  Get 7 to 9 hours of sleep, or as recommended by your caregiver.  Keep lights dim if bright lights bother you and make your headaches worse. SEEK MEDICAL CARE IF:   You have problems with the medicines you were prescribed.  Your medicines are not working.  You have a change from the usual headache.  You have nausea or vomiting. SEEK IMMEDIATE MEDICAL CARE IF:   Your headache becomes severe.  You have a fever.  You have  a stiff neck.  You have loss of vision.  You have muscular weakness or loss of muscle control.  You start losing your balance or have trouble walking.  You feel faint or pass out.  You have severe symptoms that are different from your first symptoms. MAKE SURE YOU:   Understand these instructions.  Will watch your condition.  Will get help right away if you are not doing well or get worse. Document Released: 05/08/2005 Document Revised: 07/31/2011 Document Reviewed: 05/24/2011 [facility] Patient Information 2015 [place], [place]. This information is not intended to replace advice given to you by your health care provider. Make sure you discuss any questions you have with your health care provider.

## 2014-05-03 NOTE — ED Provider Notes (Signed)
CSN: [id number]     Arrival date & time 05/02/14  2329 History  This chart was scribed for Kalman Drape, MD by [facility], ED Scribe. The patient was seen in A03C/A03C and the patient's care was started at 3:11 AM. Chief Complaint  Patient presents with  . Hypertension   HPI  HPI Comments: [name] is a 80 y.o. female with a history of HTN who presents to the Emergency Department complaining of HTN onset 2 days ago. Pt is taking metoprolol.  Patient took normal dose in the morning and a half dose Saturday afternoon. Her PCP also prescribed Norvasc and she last took it today at 3:00 PM. Pt has HA onset 2 days ago. She has taken tylenol and tramadol for no relief. She currently has a HA. Pt gets periodic HAs and gets them about once a week. She denies leg swelling and numbness.  Pt is not has an ongoing gait problem and weakness. Pt uses oxygen at home and was admitted to the hospital last month because of low O2.  Past Medical History  Diagnosis Date  . Hypertension   . Osteoporosis   . Back pain   . Vitamin D deficiency   . Proteinuria   . Angioedema    Past Surgical History  Procedure Laterality Date  . Breast surgery    . Spine surgery     Family History  Problem Relation Age of Onset  . Family history unknown: Yes   History  Substance Use Topics  . Smoking status: Former Smoker -- 1.00 packs/day for 88 years    Types: Cigarettes    Start date: [birth year]05/1949    Quit date: 02/27/2009  . Smokeless tobacco: Never Used     Comment: quit smoking 5 years ago  . Alcohol Use: No   OB History    No data available     Review of Systems  Cardiovascular: Negative for leg swelling.  Musculoskeletal: Positive for gait problem.  Neurological: Positive for weakness and headaches. Negative for numbness.   Allergies  Quinapril  Home Medications   Prior to Admission medications   Medication Sig Start Date End Date Taking? Authorizing Provider  alendronate (FOSAMAX)  70 MG tablet Take 70 mg by mouth once a week. Take with a full glass of water on an empty stomach.   Yes Historical Provider, MD  amLODipine (NORVASC) 5 MG tablet Take 5 mg by mouth daily.   Yes Historical Provider, MD  Cholecalciferol (VITAMIN D-3) 5000 UNITS TABS Take 1 tablet by mouth daily.    Yes Historical Provider, MD  dipyridamole-aspirin (AGGRENOX) 200-25 MG per 12 hr capsule Take 1 capsule by mouth 2 (two) times daily.   Yes Historical Provider, MD  gabapentin (NEURONTIN) 100 MG capsule Take 100 mg by mouth at bedtime.    Yes Historical Provider, MD  metoprolol succinate (TOPROL-XL) 25 MG 24 hr tablet Take 25 mg by mouth daily.   Yes Historical Provider, MD  traMADol (ULTRAM) 50 MG tablet Take 50 mg by mouth as needed for moderate pain.    Yes Historical Provider, MD  calcium carbonate 1250 MG capsule Take 1,250 mg by mouth 2 (two) times daily with a meal.     Historical Provider, MD   BP 173/80 mmHg  Pulse 65  Temp(Src) 97.6 F (36.4 C) (Oral)  Resp 16  SpO2 93% Physical Exam  Constitutional: She is oriented to person, place, and time. She appears well-developed and well-nourished. She appears distressed.  HENT:  Head: Normocephalic and atraumatic.  Nose: Nose normal.  Mouth/Throat: Oropharynx is clear and moist.  Eyes: Conjunctivae and EOM are normal. Pupils are equal, round, and reactive to light.  Neck: Normal range of motion. Neck supple. No JVD present. No tracheal deviation present. No thyromegaly present.  Cardiovascular: Normal rate, regular rhythm, normal heart sounds and intact distal pulses.  Exam reveals no gallop and no friction rub.   No murmur heard. Pulmonary/Chest: Effort normal and breath sounds normal. No stridor. No respiratory distress. She has no wheezes. She has no rales. She exhibits no tenderness.  Abdominal: Soft. Bowel sounds are normal. She exhibits no distension and no mass. There is no tenderness. There is no rebound and no guarding.   Musculoskeletal: Normal range of motion. She exhibits no edema or tenderness.  Lymphadenopathy:    She has no cervical adenopathy.  Neurological: She is alert and oriented to person, place, and time. She displays normal reflexes. No cranial nerve deficit. She exhibits normal muscle tone. Coordination normal.  Skin: Skin is warm and dry. No rash noted. No erythema. No pallor.  Psychiatric: She has a normal mood and affect. Her behavior is normal. Judgment and thought content normal.  Nursing note and vitals reviewed.   ED Course  Procedures  DIAGNOSTIC STUDIES: Oxygen Saturation is 93% on room air, normal by my interpretation.    COORDINATION OF CARE: 3:22 AM Discussed treatment plan with pt at bedside and pt agreed to plan.   Labs Review Labs Reviewed  CBC WITH DIFFERENTIAL - Abnormal; Notable for the following:    WBC 14.2 (*)    Platelets 526 (*)    Neutrophils Relative % 78 (*)    Neutro Abs 11.3 (*)    Basophils Absolute 0.2 (*)    All other components within normal limits  COMPREHENSIVE METABOLIC PANEL - Abnormal; Notable for the following:    Potassium 3.6 (*)    Glucose, Bld 146 (*)    GFR calc non Af Amer 79 (*)    All other components within normal limits    Imaging Review Ct Head Wo Contrast  05/03/2014   CLINICAL DATA:  80 year old female with headache since Friday  EXAM: CT HEAD WITHOUT CONTRAST  TECHNIQUE: Contiguous axial images were obtained from the base of the skull through the vertex without intravenous contrast.  COMPARISON:  Prior brain MRI 03/27/2014  FINDINGS: Negative for acute intracranial hemorrhage, acute infarction, mass, mass effect, hydrocephalus or midline shift. Gray-white differentiation is preserved throughout. Atrophy, ex vacuo dilatation and moderate chronic microvascular ischemic white matter disease are in significantly changed comparing to the recent MRI of the brain. No focal soft tissue or calvarial abnormality. Globes and orbits are  intact and unremarkable bilaterally. Normal aeration of the mastoid air cells and paranasal sinuses.  IMPRESSION: 1. No acute intracranial abnormality. 2. Moderate chronic microvascular ischemic white matter disease.   Electronically Signed   By: Jacqulynn Cadet M.D.   On: 05/03/2014 07:08     EKG Interpretation None      MDM   Final diagnoses:  Headache  Essential hypertension  I personally performed the services described in this documentation, which was scribed in my presence. The recorded information has been reviewed and is accurate.  80 year old female with elevated blood pressure today, improving here in the emergency part without intervention and global headache.  Although patient has history of headache.  She reports this headache is different.  Neurologically she is normal.  Primary care doctor  is managing her blood pressure medicines adding on amlodipine recently.  Physical exam is normal, no temporal tenderness, no neck stiffness.  Labs show mild elevation in blood cell count.  CT scan without acute findings.  Patient to be discharged home to follow-up with her primary care doctor.    Kalman Drape, MD 05/04/14 [phone]

## 2014-05-03 NOTE — ED Notes (Signed)
Patient transported to CT 

## 2014-05-03 NOTE — ED Notes (Signed)
Declined W/C at D/C and was escorted to lobby by RN. 

## 2014-05-07 DIAGNOSIS — W19XXXD Unspecified fall, subsequent encounter: Secondary | ICD-10-CM | POA: Diagnosis not present

## 2014-05-07 DIAGNOSIS — R2689 Other abnormalities of gait and mobility: Secondary | ICD-10-CM | POA: Diagnosis not present

## 2014-05-11 DIAGNOSIS — R51 Headache: Secondary | ICD-10-CM | POA: Diagnosis not present

## 2014-05-11 DIAGNOSIS — I10 Essential (primary) hypertension: Secondary | ICD-10-CM | POA: Diagnosis not present

## 2014-05-12 ENCOUNTER — Ambulatory Visit: Payer: Medicare Other | Admitting: Hematology & Oncology

## 2014-05-12 ENCOUNTER — Other Ambulatory Visit: Payer: Medicare Other | Admitting: Lab

## 2014-05-12 DIAGNOSIS — R2689 Other abnormalities of gait and mobility: Secondary | ICD-10-CM | POA: Diagnosis not present

## 2014-05-12 DIAGNOSIS — W19XXXD Unspecified fall, subsequent encounter: Secondary | ICD-10-CM | POA: Diagnosis not present

## 2014-05-13 ENCOUNTER — Ambulatory Visit: Payer: Medicare Other | Admitting: Hematology & Oncology

## 2014-05-13 ENCOUNTER — Other Ambulatory Visit: Payer: Medicare Other | Admitting: Lab

## 2014-06-01 DIAGNOSIS — I129 Hypertensive chronic kidney disease with stage 1 through stage 4 chronic kidney disease, or unspecified chronic kidney disease: Secondary | ICD-10-CM | POA: Diagnosis not present

## 2014-06-01 DIAGNOSIS — R51 Headache: Secondary | ICD-10-CM | POA: Diagnosis not present

## 2014-06-01 DIAGNOSIS — R0902 Hypoxemia: Secondary | ICD-10-CM | POA: Diagnosis not present

## 2014-06-01 DIAGNOSIS — N183 Chronic kidney disease, stage 3 (moderate): Secondary | ICD-10-CM | POA: Diagnosis not present

## 2014-07-03 ENCOUNTER — Other Ambulatory Visit: Payer: Self-pay | Admitting: Hematology & Oncology

## 2014-07-03 DIAGNOSIS — D471 Chronic myeloproliferative disease: Secondary | ICD-10-CM

## 2014-07-06 ENCOUNTER — Telehealth: Payer: Self-pay | Admitting: Hematology & Oncology

## 2014-07-06 NOTE — Telephone Encounter (Signed)
Pt aware of 3-14 appointment per pt will try to make it if she can find a ride.

## 2014-08-03 ENCOUNTER — Ambulatory Visit (HOSPITAL_BASED_OUTPATIENT_CLINIC_OR_DEPARTMENT_OTHER): Payer: Medicare Other | Admitting: Hematology & Oncology

## 2014-08-03 ENCOUNTER — Other Ambulatory Visit (HOSPITAL_BASED_OUTPATIENT_CLINIC_OR_DEPARTMENT_OTHER): Payer: Medicare Other | Admitting: Lab

## 2014-08-03 ENCOUNTER — Encounter: Payer: Self-pay | Admitting: Hematology & Oncology

## 2014-08-03 VITALS — BP 163/70 | HR 60 | Temp 97.4°F | Resp 14 | Ht 64.0 in | Wt 119.0 lb

## 2014-08-03 DIAGNOSIS — D471 Chronic myeloproliferative disease: Secondary | ICD-10-CM

## 2014-08-03 DIAGNOSIS — C9440 Acute panmyelosis with myelofibrosis not having achieved remission: Secondary | ICD-10-CM | POA: Diagnosis not present

## 2014-08-03 DIAGNOSIS — D72829 Elevated white blood cell count, unspecified: Secondary | ICD-10-CM

## 2014-08-03 DIAGNOSIS — D473 Essential (hemorrhagic) thrombocythemia: Secondary | ICD-10-CM

## 2014-08-03 HISTORY — DX: Chronic myeloproliferative disease: D47.1

## 2014-08-03 LAB — CBC WITH DIFFERENTIAL (CANCER CENTER ONLY)
BASO#: 0.2 10*3/uL (ref 0.0–0.2)
BASO%: 1.2 % (ref 0.0–2.0)
EOS%: 2.9 % (ref 0.0–7.0)
Eosinophils Absolute: 0.5 10*3/uL (ref 0.0–0.5)
HEMATOCRIT: 44.9 % (ref 34.8–46.6)
HEMOGLOBIN: 14.6 g/dL (ref 11.6–15.9)
LYMPH#: 2.4 10*3/uL (ref 0.9–3.3)
LYMPH%: 15 % (ref 14.0–48.0)
MCH: 29.9 pg (ref 26.0–34.0)
MCHC: 32.5 g/dL (ref 32.0–36.0)
MCV: 92 fL (ref 81–101)
MONO#: 1.5 10*3/uL — AB (ref 0.1–0.9)
MONO%: 9.5 % (ref 0.0–13.0)
NEUT#: 11.4 10*3/uL — ABNORMAL HIGH (ref 1.5–6.5)
NEUT%: 71.4 % (ref 39.6–80.0)
Platelets: 488 10*3/uL — ABNORMAL HIGH (ref 145–400)
RBC: 4.88 10*6/uL (ref 3.70–5.32)
RDW: 14.1 % (ref 11.1–15.7)
WBC: 16 10*3/uL — ABNORMAL HIGH (ref 3.9–10.0)

## 2014-08-03 LAB — CHCC SATELLITE - SMEAR

## 2014-08-03 LAB — TECHNOLOGIST REVIEW CHCC SATELLITE

## 2014-08-03 NOTE — Progress Notes (Signed)
Hematology and Oncology Follow Up Visit  [name] [id number] [birth year] 81 y.o. 08/03/2014   Principle Diagnosis:   Myeloproliferative neoplasm-JAK2 positive  Current Therapy:    observation     Interim History:  Ms.  [name] is back for her second office visit. Since we last saw her, she's been doing quite well. She was seen back in November. She had a good Christmas.  She uses a cane. She says that she is a little unsteady on her feet.  In the hospital, we did a JAK2 assay and this was POSITIVE.  We did a ultrasound of her abdomen in the hospital, and there is no splenomegaly. There is no hepatomegaly.  She currently is at back at home. She is doing okay. Her son-in-law came with her today.  She is not taking aspirin. I think that a baby aspirin may not be a bad idea for her.   Her appetite is doing good. She's had no nausea or vomiting.  There's been no headache. She's had no pain in her hands or feet. She's had no burning in the hands or feet. She's had no change in bowel or bladder habits.  Overall, her performance status is ECOG 3  Medications:  Current outpatient prescriptions:  .  alendronate (FOSAMAX) 70 MG tablet, Take 70 mg by mouth once a week. Take with a full glass of water on an empty stomach., Disp: , Rfl:  .  amLODipine (NORVASC) 5 MG tablet, Take 5 mg by mouth daily., Disp: , Rfl:  .  calcium carbonate 1250 MG capsule, Take 1,250 mg by mouth 2 (two) times daily with a meal. , Disp: , Rfl:  .  Cholecalciferol (VITAMIN D-3) 5000 UNITS TABS, Take 1 tablet by mouth daily. , Disp: , Rfl:  .  dipyridamole-aspirin (AGGRENOX) 200-25 MG per 12 hr capsule, Take 1 capsule by mouth 2 (two) times daily., Disp: , Rfl:  .  gabapentin (NEURONTIN) 100 MG capsule, Take 100 mg by mouth at bedtime. , Disp: , Rfl:  .  metoprolol succinate (TOPROL-XL) 25 MG 24 hr tablet, Take 25 mg by mouth daily., Disp: , Rfl:  .  traMADol (ULTRAM) 50 MG tablet, Take 50 mg by mouth as  needed for moderate pain. , Disp: , Rfl:  .  HYDROcodone-acetaminophen (NORCO/VICODIN) 5-325 MG per tablet, Take 1-2 tablets by mouth every 4 (four) hours as needed for moderate pain or severe pain. (Patient not taking: Reported on 08/03/2014), Disp: 15 tablet, Rfl: 0  Allergies:  Allergies  Allergen Reactions  . Quinapril     unknown    Past Medical History, Surgical history, Social history, and Family History were reviewed and updated.  Review of Systems: As above  Physical Exam:  height is 5\' 4"  (1.626 m) and weight is 119 lb (53.978 kg). Her oral temperature is 97.4 F (36.3 C). Her blood pressure is 163/70 and her pulse is 60. Her respiration is 14.   Elderly, petite white female in no obvious distress. Head and neck exam shows no ocular or oral lesions. She has no palpable cervical or supraclavicular lymph nodes. Lungs are clear. Cardiac exam regular rate and rhythm with no murmurs, rubs or bruits. Abdomen is soft. She has decent bowel sounds. There is no fluid wave. There is no guarding or rebound tenderness. There is no palpable liver or spleen tip.extremities shows age related osteoarthritic changes. Back exam shows some osteoporotic changes.skin exam shows some scattered ecchymoses. Neurological exam is nonfocal.  Lab Results  Component Value Date   WBC 16.0* 08/03/2014   HGB 14.6 08/03/2014   HCT 44.9 08/03/2014   MCV 92 08/03/2014   PLT 488* 08/03/2014     Chemistry      Component Value Date/Time   NA 137 05/03/2014 0003   K 3.6* 05/03/2014 0003   CL 97 05/03/2014 0003   CO2 25 05/03/2014 0003   BUN 15 05/03/2014 0003   CREATININE 0.72 05/03/2014 0003      Component Value Date/Time   CALCIUM 9.7 05/03/2014 0003   ALKPHOS 52 05/03/2014 0003   AST 20 05/03/2014 0003   ALT 10 05/03/2014 0003   BILITOT 0.5 05/03/2014 0003         Impression and Plan: [name] is 81 year old white female. She is JAK2 positive. She has mild leukocytosis and thrombocytosis.  Her white cell count is stable. Her platelet count actually is actually a little bit better.  I looked her blood smear. I do not see any immature myeloid forms. She has several large platelets.. She has several hypersegmented polys. There are no atypical lymphocytes. There are no nucleated red cells.  I suspect that she does have a myeloproliferative neoplasm. I talked to her and her son about this. I suspect that her bone marrow is hyperactive. However, I don't think we have to treat her right now.   I want to see her back in about 3 months. I did send off a  von Willebrand panel on her.   I spent about 30 minutes with her. I answered all of her questions. I answered her son-in-law's questions.  [name], MD 3/14/201612:10 PM

## 2014-08-06 LAB — VON WILLEBRAND PANEL
COAGULATION FACTOR VIII: 189 % — AB (ref 73–140)
RISTOCETIN CO-FACTOR, PLASMA: 142 % (ref 42–200)
VON WILLEBRAND ANTIGEN, PLASMA: 206 % (ref 50–217)

## 2014-08-06 LAB — LACTATE DEHYDROGENASE: LDH: 288 U/L — ABNORMAL HIGH (ref 94–250)

## 2014-08-11 DIAGNOSIS — R238 Other skin changes: Secondary | ICD-10-CM | POA: Diagnosis not present

## 2014-08-11 DIAGNOSIS — W19XXXA Unspecified fall, initial encounter: Secondary | ICD-10-CM | POA: Diagnosis not present

## 2014-11-02 ENCOUNTER — Other Ambulatory Visit: Payer: Medicare Other

## 2014-11-02 ENCOUNTER — Ambulatory Visit: Payer: Medicare Other | Admitting: Family

## 2014-11-30 DIAGNOSIS — M7130 Other bursal cyst, unspecified site: Secondary | ICD-10-CM | POA: Diagnosis not present

## 2014-11-30 DIAGNOSIS — M810 Age-related osteoporosis without current pathological fracture: Secondary | ICD-10-CM | POA: Diagnosis not present

## 2014-11-30 DIAGNOSIS — I129 Hypertensive chronic kidney disease with stage 1 through stage 4 chronic kidney disease, or unspecified chronic kidney disease: Secondary | ICD-10-CM | POA: Diagnosis not present

## 2014-11-30 DIAGNOSIS — N183 Chronic kidney disease, stage 3 (moderate): Secondary | ICD-10-CM | POA: Diagnosis not present

## 2015-02-12 DIAGNOSIS — Z0000 Encounter for general adult medical examination without abnormal findings: Secondary | ICD-10-CM | POA: Diagnosis not present

## 2015-02-12 DIAGNOSIS — Z1389 Encounter for screening for other disorder: Secondary | ICD-10-CM | POA: Diagnosis not present

## 2015-02-12 DIAGNOSIS — Z79899 Other long term (current) drug therapy: Secondary | ICD-10-CM | POA: Diagnosis not present

## 2015-02-12 DIAGNOSIS — Z23 Encounter for immunization: Secondary | ICD-10-CM | POA: Diagnosis not present

## 2015-02-12 DIAGNOSIS — H538 Other visual disturbances: Secondary | ICD-10-CM | POA: Diagnosis not present

## 2015-02-12 DIAGNOSIS — R269 Unspecified abnormalities of gait and mobility: Secondary | ICD-10-CM | POA: Diagnosis not present

## 2015-02-12 DIAGNOSIS — I129 Hypertensive chronic kidney disease with stage 1 through stage 4 chronic kidney disease, or unspecified chronic kidney disease: Secondary | ICD-10-CM | POA: Diagnosis not present

## 2015-02-12 DIAGNOSIS — N183 Chronic kidney disease, stage 3 (moderate): Secondary | ICD-10-CM | POA: Diagnosis not present

## 2015-02-16 DIAGNOSIS — M6281 Muscle weakness (generalized): Secondary | ICD-10-CM | POA: Diagnosis not present

## 2015-02-16 DIAGNOSIS — R2689 Other abnormalities of gait and mobility: Secondary | ICD-10-CM | POA: Diagnosis not present

## 2015-02-16 DIAGNOSIS — R2681 Unsteadiness on feet: Secondary | ICD-10-CM | POA: Diagnosis not present

## 2015-02-18 DIAGNOSIS — R2689 Other abnormalities of gait and mobility: Secondary | ICD-10-CM | POA: Diagnosis not present

## 2015-02-18 DIAGNOSIS — R2681 Unsteadiness on feet: Secondary | ICD-10-CM | POA: Diagnosis not present

## 2015-02-18 DIAGNOSIS — M6281 Muscle weakness (generalized): Secondary | ICD-10-CM | POA: Diagnosis not present

## 2015-02-23 DIAGNOSIS — M6281 Muscle weakness (generalized): Secondary | ICD-10-CM | POA: Diagnosis not present

## 2015-02-23 DIAGNOSIS — R2681 Unsteadiness on feet: Secondary | ICD-10-CM | POA: Diagnosis not present

## 2015-02-23 DIAGNOSIS — R2689 Other abnormalities of gait and mobility: Secondary | ICD-10-CM | POA: Diagnosis not present

## 2015-02-25 DIAGNOSIS — M6281 Muscle weakness (generalized): Secondary | ICD-10-CM | POA: Diagnosis not present

## 2015-02-25 DIAGNOSIS — R2681 Unsteadiness on feet: Secondary | ICD-10-CM | POA: Diagnosis not present

## 2015-02-25 DIAGNOSIS — R2689 Other abnormalities of gait and mobility: Secondary | ICD-10-CM | POA: Diagnosis not present

## 2015-03-02 DIAGNOSIS — R2689 Other abnormalities of gait and mobility: Secondary | ICD-10-CM | POA: Diagnosis not present

## 2015-03-02 DIAGNOSIS — R2681 Unsteadiness on feet: Secondary | ICD-10-CM | POA: Diagnosis not present

## 2015-03-02 DIAGNOSIS — M6281 Muscle weakness (generalized): Secondary | ICD-10-CM | POA: Diagnosis not present

## 2015-03-04 DIAGNOSIS — R2681 Unsteadiness on feet: Secondary | ICD-10-CM | POA: Diagnosis not present

## 2015-03-04 DIAGNOSIS — M6281 Muscle weakness (generalized): Secondary | ICD-10-CM | POA: Diagnosis not present

## 2015-03-04 DIAGNOSIS — R2689 Other abnormalities of gait and mobility: Secondary | ICD-10-CM | POA: Diagnosis not present

## 2015-03-08 DIAGNOSIS — R808 Other proteinuria: Secondary | ICD-10-CM | POA: Diagnosis not present

## 2015-03-08 DIAGNOSIS — N952 Postmenopausal atrophic vaginitis: Secondary | ICD-10-CM | POA: Diagnosis not present

## 2015-03-08 DIAGNOSIS — R32 Unspecified urinary incontinence: Secondary | ICD-10-CM | POA: Diagnosis not present

## 2015-03-08 DIAGNOSIS — R319 Hematuria, unspecified: Secondary | ICD-10-CM | POA: Diagnosis not present

## 2015-03-08 DIAGNOSIS — Z1231 Encounter for screening mammogram for malignant neoplasm of breast: Secondary | ICD-10-CM | POA: Diagnosis not present

## 2015-03-08 DIAGNOSIS — Z1212 Encounter for screening for malignant neoplasm of rectum: Secondary | ICD-10-CM | POA: Diagnosis not present

## 2015-03-09 DIAGNOSIS — R2689 Other abnormalities of gait and mobility: Secondary | ICD-10-CM | POA: Diagnosis not present

## 2015-03-09 DIAGNOSIS — M6281 Muscle weakness (generalized): Secondary | ICD-10-CM | POA: Diagnosis not present

## 2015-03-09 DIAGNOSIS — R2681 Unsteadiness on feet: Secondary | ICD-10-CM | POA: Diagnosis not present

## 2015-03-11 DIAGNOSIS — R2681 Unsteadiness on feet: Secondary | ICD-10-CM | POA: Diagnosis not present

## 2015-03-11 DIAGNOSIS — R2689 Other abnormalities of gait and mobility: Secondary | ICD-10-CM | POA: Diagnosis not present

## 2015-03-11 DIAGNOSIS — M6281 Muscle weakness (generalized): Secondary | ICD-10-CM | POA: Diagnosis not present

## 2015-03-16 DIAGNOSIS — M6281 Muscle weakness (generalized): Secondary | ICD-10-CM | POA: Diagnosis not present

## 2015-03-16 DIAGNOSIS — R2689 Other abnormalities of gait and mobility: Secondary | ICD-10-CM | POA: Diagnosis not present

## 2015-03-16 DIAGNOSIS — R2681 Unsteadiness on feet: Secondary | ICD-10-CM | POA: Diagnosis not present

## 2015-03-18 DIAGNOSIS — R2681 Unsteadiness on feet: Secondary | ICD-10-CM | POA: Diagnosis not present

## 2015-03-18 DIAGNOSIS — R2689 Other abnormalities of gait and mobility: Secondary | ICD-10-CM | POA: Diagnosis not present

## 2015-03-18 DIAGNOSIS — M6281 Muscle weakness (generalized): Secondary | ICD-10-CM | POA: Diagnosis not present

## 2015-03-29 DIAGNOSIS — I129 Hypertensive chronic kidney disease with stage 1 through stage 4 chronic kidney disease, or unspecified chronic kidney disease: Secondary | ICD-10-CM | POA: Diagnosis not present

## 2015-03-29 DIAGNOSIS — N183 Chronic kidney disease, stage 3 (moderate): Secondary | ICD-10-CM | POA: Diagnosis not present

## 2015-03-29 DIAGNOSIS — K432 Incisional hernia without obstruction or gangrene: Secondary | ICD-10-CM | POA: Diagnosis not present

## 2015-03-30 DIAGNOSIS — R319 Hematuria, unspecified: Secondary | ICD-10-CM | POA: Diagnosis not present

## 2015-03-30 DIAGNOSIS — R3129 Other microscopic hematuria: Secondary | ICD-10-CM | POA: Diagnosis not present

## 2015-03-30 DIAGNOSIS — R809 Proteinuria, unspecified: Secondary | ICD-10-CM | POA: Diagnosis not present

## 2015-05-14 DIAGNOSIS — M4000 Postural kyphosis, site unspecified: Secondary | ICD-10-CM | POA: Diagnosis not present

## 2015-05-14 DIAGNOSIS — J387 Other diseases of larynx: Secondary | ICD-10-CM | POA: Diagnosis not present

## 2015-05-14 DIAGNOSIS — H6123 Impacted cerumen, bilateral: Secondary | ICD-10-CM | POA: Diagnosis not present

## 2015-05-14 DIAGNOSIS — M419 Scoliosis, unspecified: Secondary | ICD-10-CM | POA: Diagnosis not present

## 2015-06-15 DIAGNOSIS — L723 Sebaceous cyst: Secondary | ICD-10-CM | POA: Diagnosis not present

## 2015-06-15 DIAGNOSIS — Z872 Personal history of diseases of the skin and subcutaneous tissue: Secondary | ICD-10-CM | POA: Diagnosis not present

## 2015-06-15 DIAGNOSIS — Z85828 Personal history of other malignant neoplasm of skin: Secondary | ICD-10-CM | POA: Diagnosis not present

## 2015-07-26 DIAGNOSIS — N183 Chronic kidney disease, stage 3 (moderate): Secondary | ICD-10-CM | POA: Diagnosis not present

## 2015-07-26 DIAGNOSIS — D471 Chronic myeloproliferative disease: Secondary | ICD-10-CM | POA: Diagnosis not present

## 2015-07-26 DIAGNOSIS — I129 Hypertensive chronic kidney disease with stage 1 through stage 4 chronic kidney disease, or unspecified chronic kidney disease: Secondary | ICD-10-CM | POA: Diagnosis not present

## 2015-07-31 IMAGING — CT CT HEAD W/O CM
2 series · 16 of 30 positions shown, 20 images · non-contrast
Comparison: Prior brain MRI 03/27/2014

CLINICAL DATA: 82-year-old female with headache since [REDACTED]

EXAM:
CT HEAD WITHOUT CONTRAST
TECHNIQUE: Contiguous axial images were obtained from the base of the skull
through the vertex without intravenous contrast.

[Series 201: head w/o, idose (1) · axial · non-contrast · 0.44mm/px · z∈[+108,+228]mm · 13 of 30 slices shown, 17 images]
[im 3/30  brain]
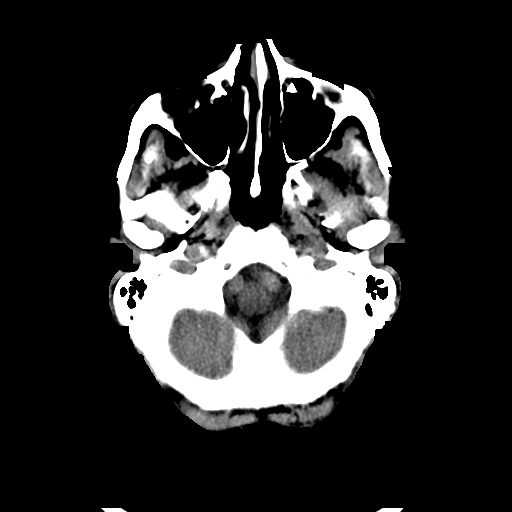
[im 3/30  bone]
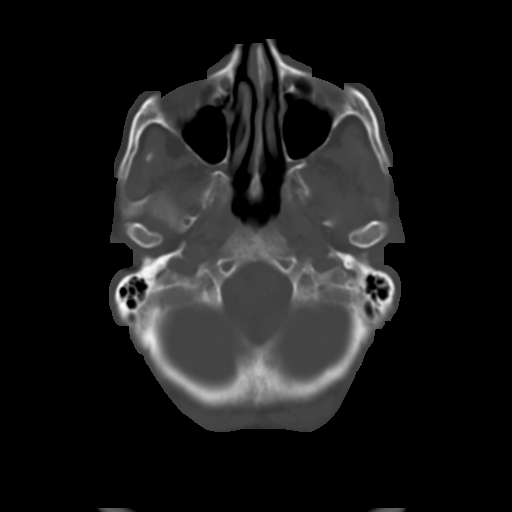
[im 5/30  brain]
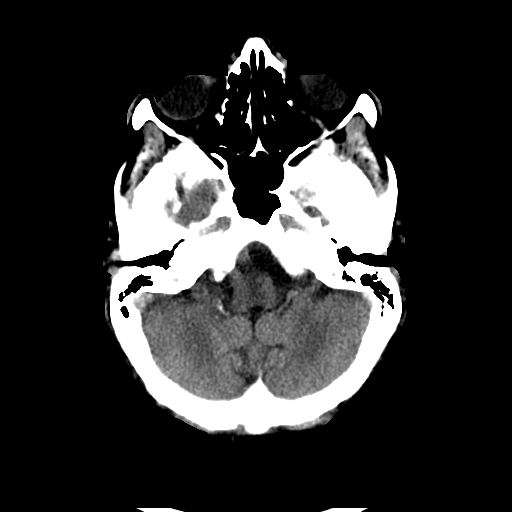
[im 7/30  brain]
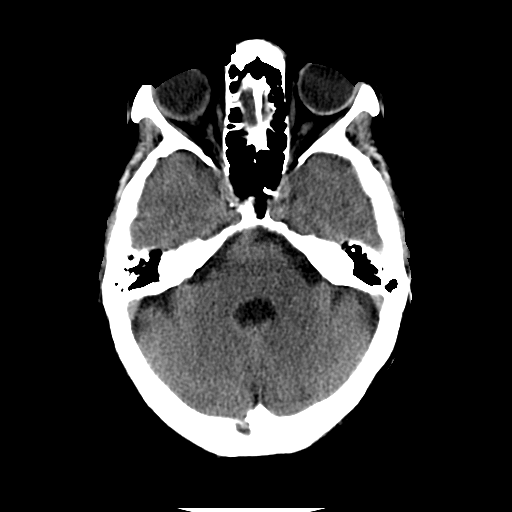
[im 9/30  brain]
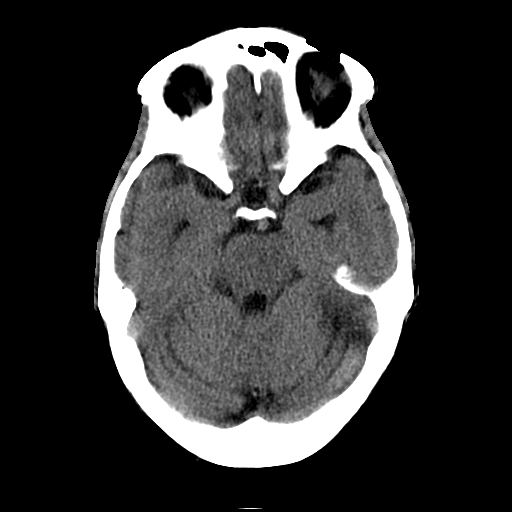
[im 11/30  brain]
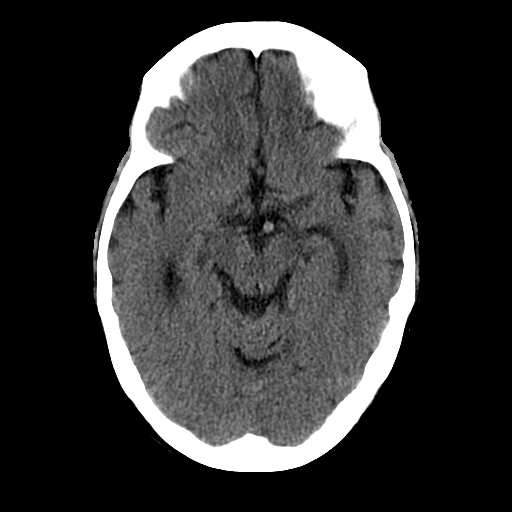
[im 11/30  bone]
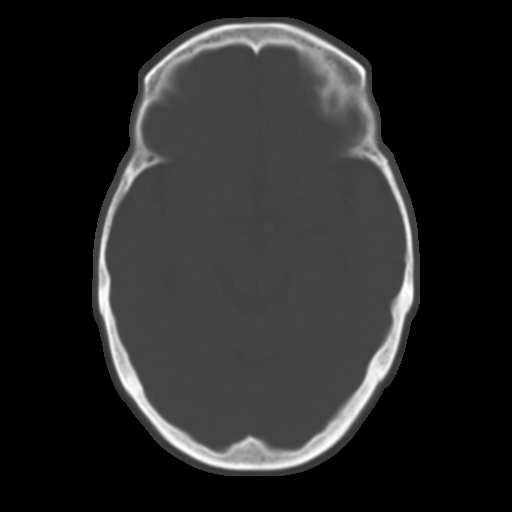
[im 13/30  brain]
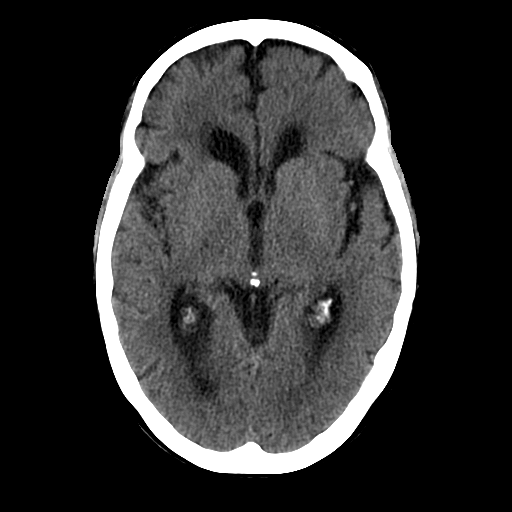
[im 15/30  brain]
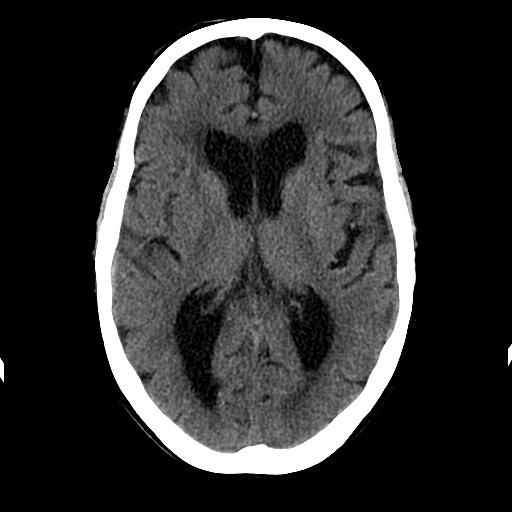
[im 17/30  brain]
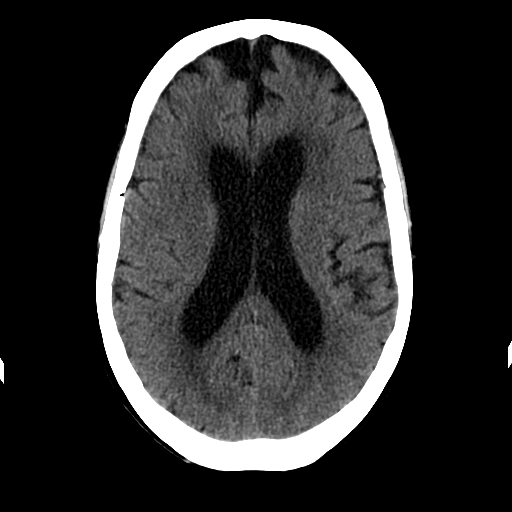
[im 19/30  brain]
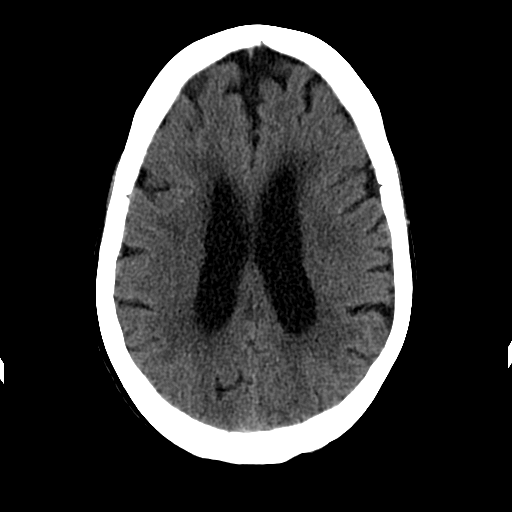
[im 19/30  bone]
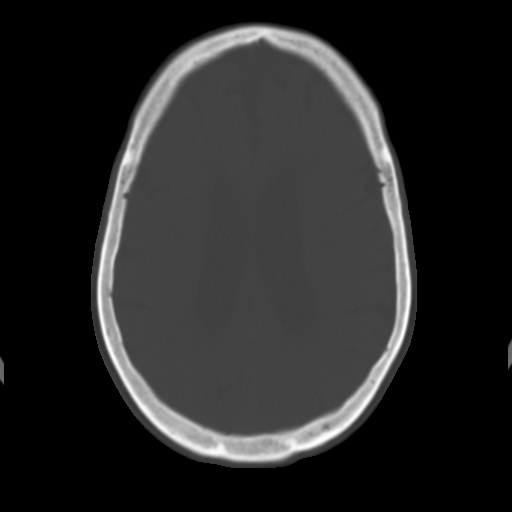
[im 21/30  brain]
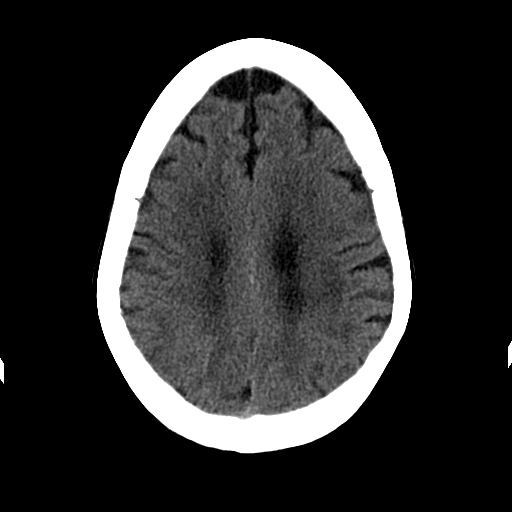
[im 23/30  brain]
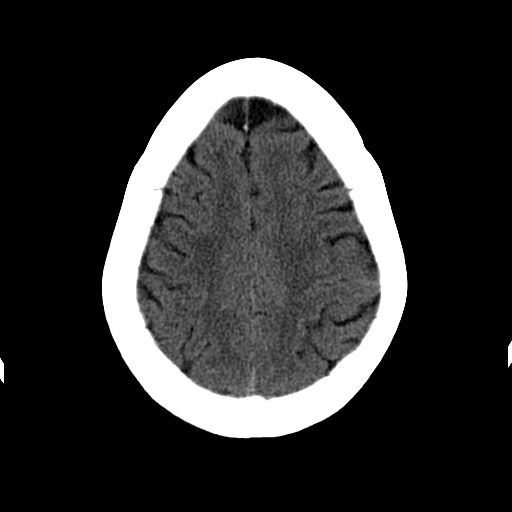
[im 25/30  brain]
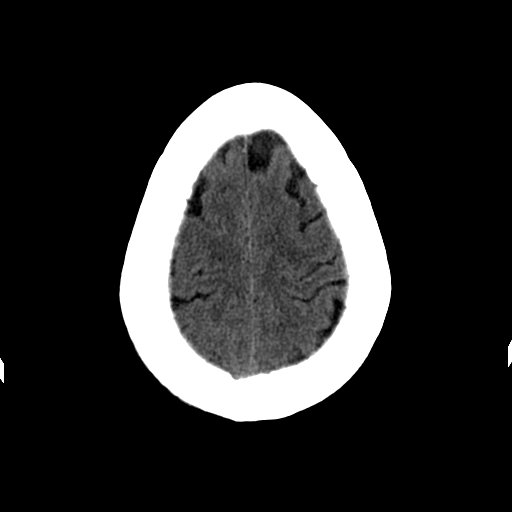
[im 27/30  brain]
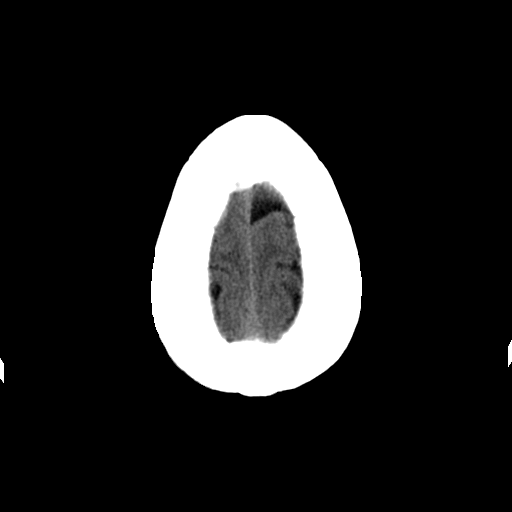
[im 27/30  bone]
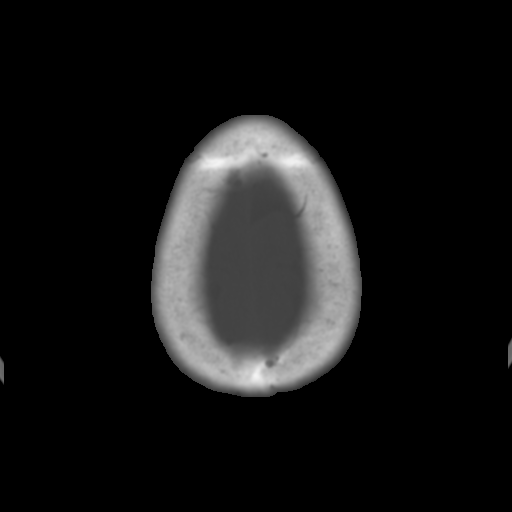

[Series 202: head w/o bone, idose (1) · axial · non-contrast · 0.44mm/px · z∈[+108,+148]mm · 3 of 30 slices shown]
[im 3/30  bone]
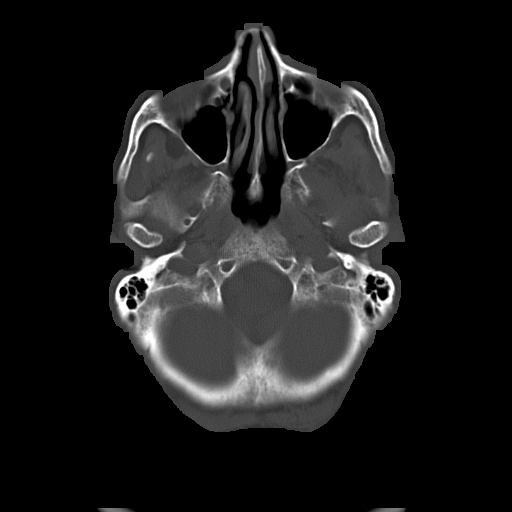
[im 7/30  bone]
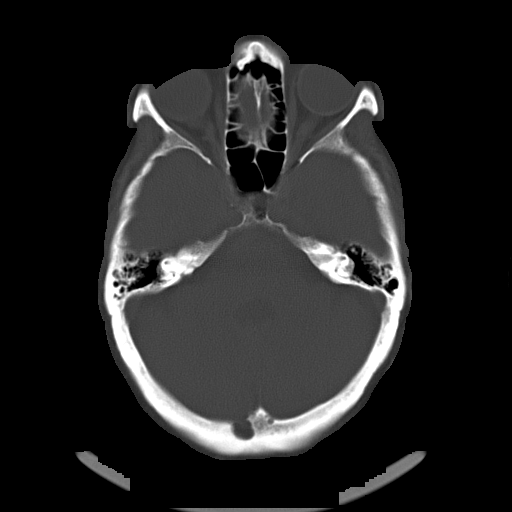
[im 11/30  bone]
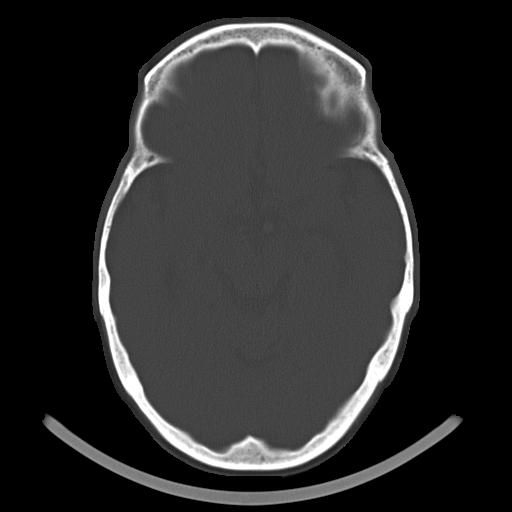

[16 of 30 positions shown; findings below may reference images not displayed]

FINDINGS: Negative for acute intracranial hemorrhage, acute infarction, mass,
mass effect, hydrocephalus or midline shift. Gray-white
differentiation is preserved throughout. Atrophy, ex vacuo
dilatation and moderate chronic microvascular ischemic white matter
disease are in significantly changed comparing to the recent MRI of
the brain. No focal soft tissue or calvarial abnormality. Globes and
orbits are intact and unremarkable bilaterally. Normal aeration of
the mastoid air cells and paranasal sinuses.
IMPRESSION: 1. No acute intracranial abnormality.
2. Moderate chronic microvascular ischemic white matter disease.

## 2015-08-09 DIAGNOSIS — I129 Hypertensive chronic kidney disease with stage 1 through stage 4 chronic kidney disease, or unspecified chronic kidney disease: Secondary | ICD-10-CM | POA: Diagnosis not present

## 2015-08-30 DIAGNOSIS — I129 Hypertensive chronic kidney disease with stage 1 through stage 4 chronic kidney disease, or unspecified chronic kidney disease: Secondary | ICD-10-CM | POA: Diagnosis not present

## 2015-09-24 DIAGNOSIS — H02423 Myogenic ptosis of bilateral eyelids: Secondary | ICD-10-CM | POA: Diagnosis not present

## 2015-11-08 DIAGNOSIS — M542 Cervicalgia: Secondary | ICD-10-CM | POA: Diagnosis not present

## 2015-11-08 DIAGNOSIS — I10 Essential (primary) hypertension: Secondary | ICD-10-CM | POA: Diagnosis not present

## 2016-02-25 DIAGNOSIS — R269 Unspecified abnormalities of gait and mobility: Secondary | ICD-10-CM | POA: Diagnosis not present

## 2016-02-25 DIAGNOSIS — Z79899 Other long term (current) drug therapy: Secondary | ICD-10-CM | POA: Diagnosis not present

## 2016-02-25 DIAGNOSIS — M810 Age-related osteoporosis without current pathological fracture: Secondary | ICD-10-CM | POA: Diagnosis not present

## 2016-02-25 DIAGNOSIS — E559 Vitamin D deficiency, unspecified: Secondary | ICD-10-CM | POA: Diagnosis not present

## 2016-02-25 DIAGNOSIS — I129 Hypertensive chronic kidney disease with stage 1 through stage 4 chronic kidney disease, or unspecified chronic kidney disease: Secondary | ICD-10-CM | POA: Diagnosis not present

## 2016-02-25 DIAGNOSIS — I878 Other specified disorders of veins: Secondary | ICD-10-CM | POA: Diagnosis not present

## 2016-02-25 DIAGNOSIS — Z1389 Encounter for screening for other disorder: Secondary | ICD-10-CM | POA: Diagnosis not present

## 2016-02-25 DIAGNOSIS — Z23 Encounter for immunization: Secondary | ICD-10-CM | POA: Diagnosis not present

## 2016-02-25 DIAGNOSIS — N183 Chronic kidney disease, stage 3 (moderate): Secondary | ICD-10-CM | POA: Diagnosis not present

## 2016-02-25 DIAGNOSIS — Z0000 Encounter for general adult medical examination without abnormal findings: Secondary | ICD-10-CM | POA: Diagnosis not present

## 2016-02-25 DIAGNOSIS — D471 Chronic myeloproliferative disease: Secondary | ICD-10-CM | POA: Diagnosis not present

## 2016-03-01 DIAGNOSIS — R2689 Other abnormalities of gait and mobility: Secondary | ICD-10-CM | POA: Diagnosis not present

## 2016-03-01 DIAGNOSIS — M810 Age-related osteoporosis without current pathological fracture: Secondary | ICD-10-CM | POA: Diagnosis not present

## 2016-03-01 DIAGNOSIS — M6281 Muscle weakness (generalized): Secondary | ICD-10-CM | POA: Diagnosis not present

## 2016-03-07 DIAGNOSIS — M6281 Muscle weakness (generalized): Secondary | ICD-10-CM | POA: Diagnosis not present

## 2016-03-07 DIAGNOSIS — M810 Age-related osteoporosis without current pathological fracture: Secondary | ICD-10-CM | POA: Diagnosis not present

## 2016-03-07 DIAGNOSIS — R2689 Other abnormalities of gait and mobility: Secondary | ICD-10-CM | POA: Diagnosis not present

## 2016-03-10 DIAGNOSIS — M810 Age-related osteoporosis without current pathological fracture: Secondary | ICD-10-CM | POA: Diagnosis not present

## 2016-03-10 DIAGNOSIS — M6281 Muscle weakness (generalized): Secondary | ICD-10-CM | POA: Diagnosis not present

## 2016-03-10 DIAGNOSIS — R2689 Other abnormalities of gait and mobility: Secondary | ICD-10-CM | POA: Diagnosis not present

## 2016-03-14 DIAGNOSIS — M6281 Muscle weakness (generalized): Secondary | ICD-10-CM | POA: Diagnosis not present

## 2016-03-14 DIAGNOSIS — M810 Age-related osteoporosis without current pathological fracture: Secondary | ICD-10-CM | POA: Diagnosis not present

## 2016-03-14 DIAGNOSIS — R2689 Other abnormalities of gait and mobility: Secondary | ICD-10-CM | POA: Diagnosis not present

## 2016-03-17 DIAGNOSIS — R2689 Other abnormalities of gait and mobility: Secondary | ICD-10-CM | POA: Diagnosis not present

## 2016-03-17 DIAGNOSIS — M6281 Muscle weakness (generalized): Secondary | ICD-10-CM | POA: Diagnosis not present

## 2016-03-17 DIAGNOSIS — M810 Age-related osteoporosis without current pathological fracture: Secondary | ICD-10-CM | POA: Diagnosis not present

## 2016-03-21 DIAGNOSIS — M6281 Muscle weakness (generalized): Secondary | ICD-10-CM | POA: Diagnosis not present

## 2016-03-21 DIAGNOSIS — R2689 Other abnormalities of gait and mobility: Secondary | ICD-10-CM | POA: Diagnosis not present

## 2016-03-21 DIAGNOSIS — M810 Age-related osteoporosis without current pathological fracture: Secondary | ICD-10-CM | POA: Diagnosis not present

## 2016-03-22 DIAGNOSIS — M810 Age-related osteoporosis without current pathological fracture: Secondary | ICD-10-CM | POA: Diagnosis not present

## 2016-03-23 DIAGNOSIS — M810 Age-related osteoporosis without current pathological fracture: Secondary | ICD-10-CM | POA: Diagnosis not present

## 2016-03-23 DIAGNOSIS — R2689 Other abnormalities of gait and mobility: Secondary | ICD-10-CM | POA: Diagnosis not present

## 2016-03-23 DIAGNOSIS — M6281 Muscle weakness (generalized): Secondary | ICD-10-CM | POA: Diagnosis not present

## 2016-03-28 DIAGNOSIS — M810 Age-related osteoporosis without current pathological fracture: Secondary | ICD-10-CM | POA: Diagnosis not present

## 2016-03-28 DIAGNOSIS — M6281 Muscle weakness (generalized): Secondary | ICD-10-CM | POA: Diagnosis not present

## 2016-03-28 DIAGNOSIS — R2689 Other abnormalities of gait and mobility: Secondary | ICD-10-CM | POA: Diagnosis not present

## 2016-04-04 DIAGNOSIS — R2689 Other abnormalities of gait and mobility: Secondary | ICD-10-CM | POA: Diagnosis not present

## 2016-04-04 DIAGNOSIS — M6281 Muscle weakness (generalized): Secondary | ICD-10-CM | POA: Diagnosis not present

## 2016-04-04 DIAGNOSIS — M810 Age-related osteoporosis without current pathological fracture: Secondary | ICD-10-CM | POA: Diagnosis not present

## 2016-04-06 DIAGNOSIS — M810 Age-related osteoporosis without current pathological fracture: Secondary | ICD-10-CM | POA: Diagnosis not present

## 2016-04-06 DIAGNOSIS — M6281 Muscle weakness (generalized): Secondary | ICD-10-CM | POA: Diagnosis not present

## 2016-04-06 DIAGNOSIS — R2689 Other abnormalities of gait and mobility: Secondary | ICD-10-CM | POA: Diagnosis not present

## 2016-04-10 DIAGNOSIS — R2689 Other abnormalities of gait and mobility: Secondary | ICD-10-CM | POA: Diagnosis not present

## 2016-04-10 DIAGNOSIS — M810 Age-related osteoporosis without current pathological fracture: Secondary | ICD-10-CM | POA: Diagnosis not present

## 2016-04-10 DIAGNOSIS — M6281 Muscle weakness (generalized): Secondary | ICD-10-CM | POA: Diagnosis not present

## 2016-04-18 DIAGNOSIS — M6281 Muscle weakness (generalized): Secondary | ICD-10-CM | POA: Diagnosis not present

## 2016-04-18 DIAGNOSIS — M810 Age-related osteoporosis without current pathological fracture: Secondary | ICD-10-CM | POA: Diagnosis not present

## 2016-04-18 DIAGNOSIS — R2689 Other abnormalities of gait and mobility: Secondary | ICD-10-CM | POA: Diagnosis not present

## 2016-04-20 DIAGNOSIS — R2689 Other abnormalities of gait and mobility: Secondary | ICD-10-CM | POA: Diagnosis not present

## 2016-04-20 DIAGNOSIS — M810 Age-related osteoporosis without current pathological fracture: Secondary | ICD-10-CM | POA: Diagnosis not present

## 2016-04-20 DIAGNOSIS — M6281 Muscle weakness (generalized): Secondary | ICD-10-CM | POA: Diagnosis not present

## 2016-04-25 DIAGNOSIS — M810 Age-related osteoporosis without current pathological fracture: Secondary | ICD-10-CM | POA: Diagnosis not present

## 2016-04-25 DIAGNOSIS — M6281 Muscle weakness (generalized): Secondary | ICD-10-CM | POA: Diagnosis not present

## 2016-04-25 DIAGNOSIS — R2689 Other abnormalities of gait and mobility: Secondary | ICD-10-CM | POA: Diagnosis not present

## 2016-06-05 DIAGNOSIS — R319 Hematuria, unspecified: Secondary | ICD-10-CM | POA: Diagnosis not present

## 2016-06-05 DIAGNOSIS — R32 Unspecified urinary incontinence: Secondary | ICD-10-CM | POA: Diagnosis not present

## 2016-06-05 DIAGNOSIS — N952 Postmenopausal atrophic vaginitis: Secondary | ICD-10-CM | POA: Diagnosis not present

## 2016-06-05 DIAGNOSIS — R809 Proteinuria, unspecified: Secondary | ICD-10-CM | POA: Diagnosis not present

## 2016-06-05 DIAGNOSIS — Z1212 Encounter for screening for malignant neoplasm of rectum: Secondary | ICD-10-CM | POA: Diagnosis not present

## 2016-06-05 DIAGNOSIS — R3129 Other microscopic hematuria: Secondary | ICD-10-CM | POA: Diagnosis not present

## 2016-06-05 DIAGNOSIS — Z853 Personal history of malignant neoplasm of breast: Secondary | ICD-10-CM | POA: Diagnosis not present

## 2016-06-05 DIAGNOSIS — Z1231 Encounter for screening mammogram for malignant neoplasm of breast: Secondary | ICD-10-CM | POA: Diagnosis not present

## 2016-06-15 DIAGNOSIS — D2361 Other benign neoplasm of skin of right upper limb, including shoulder: Secondary | ICD-10-CM | POA: Diagnosis not present

## 2016-06-15 DIAGNOSIS — D230 Other benign neoplasm of skin of lip: Secondary | ICD-10-CM | POA: Diagnosis not present

## 2016-06-15 DIAGNOSIS — Z719 Counseling, unspecified: Secondary | ICD-10-CM | POA: Diagnosis not present

## 2016-06-15 DIAGNOSIS — Z872 Personal history of diseases of the skin and subcutaneous tissue: Secondary | ICD-10-CM | POA: Diagnosis not present

## 2016-06-15 DIAGNOSIS — D2339 Other benign neoplasm of skin of other parts of face: Secondary | ICD-10-CM | POA: Diagnosis not present

## 2016-06-15 DIAGNOSIS — D2362 Other benign neoplasm of skin of left upper limb, including shoulder: Secondary | ICD-10-CM | POA: Diagnosis not present

## 2016-06-15 DIAGNOSIS — D234 Other benign neoplasm of skin of scalp and neck: Secondary | ICD-10-CM | POA: Diagnosis not present

## 2016-06-15 DIAGNOSIS — Z85828 Personal history of other malignant neoplasm of skin: Secondary | ICD-10-CM | POA: Diagnosis not present

## 2016-06-21 DIAGNOSIS — D2339 Other benign neoplasm of skin of other parts of face: Secondary | ICD-10-CM | POA: Diagnosis not present

## 2016-08-08 DIAGNOSIS — D2312 Other benign neoplasm of skin of left eyelid, including canthus: Secondary | ICD-10-CM | POA: Diagnosis not present

## 2016-08-08 DIAGNOSIS — Z719 Counseling, unspecified: Secondary | ICD-10-CM | POA: Diagnosis not present

## 2016-08-08 DIAGNOSIS — D2311 Other benign neoplasm of skin of right eyelid, including canthus: Secondary | ICD-10-CM | POA: Diagnosis not present

## 2016-08-08 DIAGNOSIS — D234 Other benign neoplasm of skin of scalp and neck: Secondary | ICD-10-CM | POA: Diagnosis not present

## 2016-08-08 DIAGNOSIS — Z85828 Personal history of other malignant neoplasm of skin: Secondary | ICD-10-CM | POA: Diagnosis not present

## 2016-08-08 DIAGNOSIS — L570 Actinic keratosis: Secondary | ICD-10-CM | POA: Diagnosis not present

## 2016-08-08 DIAGNOSIS — D235 Other benign neoplasm of skin of trunk: Secondary | ICD-10-CM | POA: Diagnosis not present

## 2016-08-08 DIAGNOSIS — D2339 Other benign neoplasm of skin of other parts of face: Secondary | ICD-10-CM | POA: Diagnosis not present

## 2016-08-08 DIAGNOSIS — Z872 Personal history of diseases of the skin and subcutaneous tissue: Secondary | ICD-10-CM | POA: Diagnosis not present

## 2016-08-08 DIAGNOSIS — D230 Other benign neoplasm of skin of lip: Secondary | ICD-10-CM | POA: Diagnosis not present

## 2016-08-28 DIAGNOSIS — N183 Chronic kidney disease, stage 3 (moderate): Secondary | ICD-10-CM | POA: Diagnosis not present

## 2016-08-28 DIAGNOSIS — I129 Hypertensive chronic kidney disease with stage 1 through stage 4 chronic kidney disease, or unspecified chronic kidney disease: Secondary | ICD-10-CM | POA: Diagnosis not present

## 2016-09-26 DIAGNOSIS — H01021 Squamous blepharitis right upper eyelid: Secondary | ICD-10-CM | POA: Diagnosis not present

## 2016-10-19 DIAGNOSIS — D2362 Other benign neoplasm of skin of left upper limb, including shoulder: Secondary | ICD-10-CM | POA: Diagnosis not present

## 2016-10-19 DIAGNOSIS — D2311 Other benign neoplasm of skin of right eyelid, including canthus: Secondary | ICD-10-CM | POA: Diagnosis not present

## 2016-10-19 DIAGNOSIS — D2321 Other benign neoplasm of skin of right ear and external auricular canal: Secondary | ICD-10-CM | POA: Diagnosis not present

## 2016-10-19 DIAGNOSIS — D230 Other benign neoplasm of skin of lip: Secondary | ICD-10-CM | POA: Diagnosis not present

## 2016-10-19 DIAGNOSIS — D2339 Other benign neoplasm of skin of other parts of face: Secondary | ICD-10-CM | POA: Diagnosis not present

## 2016-10-19 DIAGNOSIS — D2372 Other benign neoplasm of skin of left lower limb, including hip: Secondary | ICD-10-CM | POA: Diagnosis not present

## 2016-10-19 DIAGNOSIS — Z872 Personal history of diseases of the skin and subcutaneous tissue: Secondary | ICD-10-CM | POA: Diagnosis not present

## 2016-10-19 DIAGNOSIS — D2361 Other benign neoplasm of skin of right upper limb, including shoulder: Secondary | ICD-10-CM | POA: Diagnosis not present

## 2016-10-19 DIAGNOSIS — D2322 Other benign neoplasm of skin of left ear and external auricular canal: Secondary | ICD-10-CM | POA: Diagnosis not present

## 2016-10-19 DIAGNOSIS — D2312 Other benign neoplasm of skin of left eyelid, including canthus: Secondary | ICD-10-CM | POA: Diagnosis not present

## 2016-10-19 DIAGNOSIS — D234 Other benign neoplasm of skin of scalp and neck: Secondary | ICD-10-CM | POA: Diagnosis not present

## 2016-10-19 DIAGNOSIS — Z85828 Personal history of other malignant neoplasm of skin: Secondary | ICD-10-CM | POA: Diagnosis not present

## 2016-11-09 ENCOUNTER — Other Ambulatory Visit: Payer: Self-pay | Admitting: Geriatric Medicine

## 2016-11-09 ENCOUNTER — Ambulatory Visit
Admission: RE | Admit: 2016-11-09 | Discharge: 2016-11-09 | Disposition: A | Discharge status: Home / Self Care | Payer: Medicare Other | Source: Ambulatory Visit | Attending: Geriatric Medicine | Admitting: Geriatric Medicine

## 2016-11-09 DIAGNOSIS — R0600 Dyspnea, unspecified: Secondary | ICD-10-CM

## 2016-11-09 DIAGNOSIS — Z79899 Other long term (current) drug therapy: Secondary | ICD-10-CM | POA: Diagnosis not present

## 2016-11-09 DIAGNOSIS — R0602 Shortness of breath: Secondary | ICD-10-CM | POA: Diagnosis not present

## 2016-11-09 DIAGNOSIS — I129 Hypertensive chronic kidney disease with stage 1 through stage 4 chronic kidney disease, or unspecified chronic kidney disease: Secondary | ICD-10-CM | POA: Diagnosis not present

## 2016-11-09 DIAGNOSIS — R05 Cough: Secondary | ICD-10-CM | POA: Diagnosis not present

## 2016-11-09 DIAGNOSIS — R51 Headache: Secondary | ICD-10-CM | POA: Diagnosis not present

## 2016-11-09 DIAGNOSIS — N183 Chronic kidney disease, stage 3 (moderate): Secondary | ICD-10-CM | POA: Diagnosis not present

## 2016-11-27 DIAGNOSIS — I129 Hypertensive chronic kidney disease with stage 1 through stage 4 chronic kidney disease, or unspecified chronic kidney disease: Secondary | ICD-10-CM | POA: Diagnosis not present

## 2016-11-27 DIAGNOSIS — N183 Chronic kidney disease, stage 3 (moderate): Secondary | ICD-10-CM | POA: Diagnosis not present

## 2016-11-27 DIAGNOSIS — R0609 Other forms of dyspnea: Secondary | ICD-10-CM | POA: Diagnosis not present

## 2016-11-27 DIAGNOSIS — R269 Unspecified abnormalities of gait and mobility: Secondary | ICD-10-CM | POA: Diagnosis not present

## 2016-11-28 ENCOUNTER — Other Ambulatory Visit: Payer: Self-pay | Admitting: Geriatric Medicine

## 2016-11-28 DIAGNOSIS — R0609 Other forms of dyspnea: Principal | ICD-10-CM

## 2016-12-05 DIAGNOSIS — M6281 Muscle weakness (generalized): Secondary | ICD-10-CM | POA: Diagnosis not present

## 2016-12-05 DIAGNOSIS — R2689 Other abnormalities of gait and mobility: Secondary | ICD-10-CM | POA: Diagnosis not present

## 2016-12-06 ENCOUNTER — Encounter (HOSPITAL_COMMUNITY): Payer: Self-pay | Admitting: *Deleted

## 2016-12-06 ENCOUNTER — Ambulatory Visit (HOSPITAL_COMMUNITY): Payer: Medicare Other | Attending: Cardiology

## 2016-12-06 ENCOUNTER — Other Ambulatory Visit: Payer: Self-pay

## 2016-12-06 DIAGNOSIS — I340 Nonrheumatic mitral (valve) insufficiency: Secondary | ICD-10-CM | POA: Insufficient documentation

## 2016-12-06 DIAGNOSIS — I119 Hypertensive heart disease without heart failure: Secondary | ICD-10-CM | POA: Insufficient documentation

## 2016-12-06 DIAGNOSIS — R0609 Other forms of dyspnea: Secondary | ICD-10-CM | POA: Diagnosis not present

## 2016-12-06 NOTE — Progress Notes (Unsigned)
Talked to DOD (Dr. Acie Fredrickson about patient EKG (bigeminy) . She did not have to be seen.

## 2016-12-07 DIAGNOSIS — M6281 Muscle weakness (generalized): Secondary | ICD-10-CM | POA: Diagnosis not present

## 2016-12-07 DIAGNOSIS — R2689 Other abnormalities of gait and mobility: Secondary | ICD-10-CM | POA: Diagnosis not present

## 2016-12-12 DIAGNOSIS — R2689 Other abnormalities of gait and mobility: Secondary | ICD-10-CM | POA: Diagnosis not present

## 2016-12-12 DIAGNOSIS — M6281 Muscle weakness (generalized): Secondary | ICD-10-CM | POA: Diagnosis not present

## 2016-12-14 DIAGNOSIS — R2689 Other abnormalities of gait and mobility: Secondary | ICD-10-CM | POA: Diagnosis not present

## 2016-12-14 DIAGNOSIS — M6281 Muscle weakness (generalized): Secondary | ICD-10-CM | POA: Diagnosis not present

## 2016-12-19 DIAGNOSIS — L570 Actinic keratosis: Secondary | ICD-10-CM | POA: Diagnosis not present

## 2016-12-19 DIAGNOSIS — D2322 Other benign neoplasm of skin of left ear and external auricular canal: Secondary | ICD-10-CM | POA: Diagnosis not present

## 2016-12-19 DIAGNOSIS — D234 Other benign neoplasm of skin of scalp and neck: Secondary | ICD-10-CM | POA: Diagnosis not present

## 2016-12-19 DIAGNOSIS — D230 Other benign neoplasm of skin of lip: Secondary | ICD-10-CM | POA: Diagnosis not present

## 2016-12-19 DIAGNOSIS — D2311 Other benign neoplasm of skin of right eyelid, including canthus: Secondary | ICD-10-CM | POA: Diagnosis not present

## 2016-12-19 DIAGNOSIS — Z872 Personal history of diseases of the skin and subcutaneous tissue: Secondary | ICD-10-CM | POA: Diagnosis not present

## 2016-12-19 DIAGNOSIS — L723 Sebaceous cyst: Secondary | ICD-10-CM | POA: Diagnosis not present

## 2016-12-19 DIAGNOSIS — D2339 Other benign neoplasm of skin of other parts of face: Secondary | ICD-10-CM | POA: Diagnosis not present

## 2016-12-19 DIAGNOSIS — D2321 Other benign neoplasm of skin of right ear and external auricular canal: Secondary | ICD-10-CM | POA: Diagnosis not present

## 2016-12-19 DIAGNOSIS — Z719 Counseling, unspecified: Secondary | ICD-10-CM | POA: Diagnosis not present

## 2016-12-19 DIAGNOSIS — D485 Neoplasm of uncertain behavior of skin: Secondary | ICD-10-CM | POA: Diagnosis not present

## 2016-12-19 DIAGNOSIS — D2312 Other benign neoplasm of skin of left eyelid, including canthus: Secondary | ICD-10-CM | POA: Diagnosis not present

## 2016-12-19 DIAGNOSIS — Z85828 Personal history of other malignant neoplasm of skin: Secondary | ICD-10-CM | POA: Diagnosis not present

## 2016-12-21 DIAGNOSIS — M6281 Muscle weakness (generalized): Secondary | ICD-10-CM | POA: Diagnosis not present

## 2016-12-21 DIAGNOSIS — R2689 Other abnormalities of gait and mobility: Secondary | ICD-10-CM | POA: Diagnosis not present

## 2016-12-23 DIAGNOSIS — Z139 Encounter for screening, unspecified: Secondary | ICD-10-CM | POA: Diagnosis not present

## 2016-12-23 DIAGNOSIS — R001 Bradycardia, unspecified: Secondary | ICD-10-CM | POA: Diagnosis not present

## 2016-12-23 DIAGNOSIS — S0083XA Contusion of other part of head, initial encounter: Secondary | ICD-10-CM | POA: Diagnosis not present

## 2016-12-23 DIAGNOSIS — S8002XA Contusion of left knee, initial encounter: Secondary | ICD-10-CM | POA: Diagnosis not present

## 2016-12-23 DIAGNOSIS — R262 Difficulty in walking, not elsewhere classified: Secondary | ICD-10-CM | POA: Diagnosis not present

## 2016-12-25 DIAGNOSIS — M6281 Muscle weakness (generalized): Secondary | ICD-10-CM | POA: Diagnosis not present

## 2016-12-25 DIAGNOSIS — R2689 Other abnormalities of gait and mobility: Secondary | ICD-10-CM | POA: Diagnosis not present

## 2017-01-09 DIAGNOSIS — R2689 Other abnormalities of gait and mobility: Secondary | ICD-10-CM | POA: Diagnosis not present

## 2017-01-09 DIAGNOSIS — M6281 Muscle weakness (generalized): Secondary | ICD-10-CM | POA: Diagnosis not present

## 2017-01-11 DIAGNOSIS — M6281 Muscle weakness (generalized): Secondary | ICD-10-CM | POA: Diagnosis not present

## 2017-01-11 DIAGNOSIS — R2689 Other abnormalities of gait and mobility: Secondary | ICD-10-CM | POA: Diagnosis not present

## 2017-01-16 DIAGNOSIS — M6281 Muscle weakness (generalized): Secondary | ICD-10-CM | POA: Diagnosis not present

## 2017-01-16 DIAGNOSIS — R2689 Other abnormalities of gait and mobility: Secondary | ICD-10-CM | POA: Diagnosis not present

## 2017-01-18 DIAGNOSIS — R2689 Other abnormalities of gait and mobility: Secondary | ICD-10-CM | POA: Diagnosis not present

## 2017-01-18 DIAGNOSIS — M6281 Muscle weakness (generalized): Secondary | ICD-10-CM | POA: Diagnosis not present

## 2017-01-23 DIAGNOSIS — M6281 Muscle weakness (generalized): Secondary | ICD-10-CM | POA: Diagnosis not present

## 2017-01-23 DIAGNOSIS — R2689 Other abnormalities of gait and mobility: Secondary | ICD-10-CM | POA: Diagnosis not present

## 2017-01-25 DIAGNOSIS — M6281 Muscle weakness (generalized): Secondary | ICD-10-CM | POA: Diagnosis not present

## 2017-01-25 DIAGNOSIS — R2689 Other abnormalities of gait and mobility: Secondary | ICD-10-CM | POA: Diagnosis not present

## 2017-01-30 DIAGNOSIS — M6281 Muscle weakness (generalized): Secondary | ICD-10-CM | POA: Diagnosis not present

## 2017-01-30 DIAGNOSIS — R2689 Other abnormalities of gait and mobility: Secondary | ICD-10-CM | POA: Diagnosis not present

## 2017-02-01 DIAGNOSIS — M6281 Muscle weakness (generalized): Secondary | ICD-10-CM | POA: Diagnosis not present

## 2017-02-01 DIAGNOSIS — R2689 Other abnormalities of gait and mobility: Secondary | ICD-10-CM | POA: Diagnosis not present

## 2017-02-06 DIAGNOSIS — R2689 Other abnormalities of gait and mobility: Secondary | ICD-10-CM | POA: Diagnosis not present

## 2017-02-06 DIAGNOSIS — M6281 Muscle weakness (generalized): Secondary | ICD-10-CM | POA: Diagnosis not present

## 2017-02-08 DIAGNOSIS — R2689 Other abnormalities of gait and mobility: Secondary | ICD-10-CM | POA: Diagnosis not present

## 2017-02-08 DIAGNOSIS — M6281 Muscle weakness (generalized): Secondary | ICD-10-CM | POA: Diagnosis not present

## 2017-02-20 DIAGNOSIS — H01021 Squamous blepharitis right upper eyelid: Secondary | ICD-10-CM | POA: Diagnosis not present

## 2017-02-20 DIAGNOSIS — H01024 Squamous blepharitis left upper eyelid: Secondary | ICD-10-CM | POA: Diagnosis not present

## 2017-02-20 DIAGNOSIS — H04122 Dry eye syndrome of left lacrimal gland: Secondary | ICD-10-CM | POA: Diagnosis not present

## 2017-03-06 DIAGNOSIS — Z85828 Personal history of other malignant neoplasm of skin: Secondary | ICD-10-CM | POA: Diagnosis not present

## 2017-03-06 DIAGNOSIS — D23112 Other benign neoplasm of skin of right lower eyelid, including canthus: Secondary | ICD-10-CM | POA: Diagnosis not present

## 2017-03-06 DIAGNOSIS — D23121 Other benign neoplasm of skin of left upper eyelid, including canthus: Secondary | ICD-10-CM | POA: Diagnosis not present

## 2017-03-06 DIAGNOSIS — D2361 Other benign neoplasm of skin of right upper limb, including shoulder: Secondary | ICD-10-CM | POA: Diagnosis not present

## 2017-03-06 DIAGNOSIS — Z872 Personal history of diseases of the skin and subcutaneous tissue: Secondary | ICD-10-CM | POA: Diagnosis not present

## 2017-03-06 DIAGNOSIS — D230 Other benign neoplasm of skin of lip: Secondary | ICD-10-CM | POA: Diagnosis not present

## 2017-03-06 DIAGNOSIS — D23111 Other benign neoplasm of skin of right upper eyelid, including canthus: Secondary | ICD-10-CM | POA: Diagnosis not present

## 2017-03-06 DIAGNOSIS — D2322 Other benign neoplasm of skin of left ear and external auricular canal: Secondary | ICD-10-CM | POA: Diagnosis not present

## 2017-03-06 DIAGNOSIS — D234 Other benign neoplasm of skin of scalp and neck: Secondary | ICD-10-CM | POA: Diagnosis not present

## 2017-03-06 DIAGNOSIS — D2339 Other benign neoplasm of skin of other parts of face: Secondary | ICD-10-CM | POA: Diagnosis not present

## 2017-03-06 DIAGNOSIS — D2362 Other benign neoplasm of skin of left upper limb, including shoulder: Secondary | ICD-10-CM | POA: Diagnosis not present

## 2017-03-06 DIAGNOSIS — L723 Sebaceous cyst: Secondary | ICD-10-CM | POA: Diagnosis not present

## 2017-03-07 DIAGNOSIS — H01024 Squamous blepharitis left upper eyelid: Secondary | ICD-10-CM | POA: Diagnosis not present

## 2017-03-07 DIAGNOSIS — H04122 Dry eye syndrome of left lacrimal gland: Secondary | ICD-10-CM | POA: Diagnosis not present

## 2017-03-07 DIAGNOSIS — H01021 Squamous blepharitis right upper eyelid: Secondary | ICD-10-CM | POA: Diagnosis not present

## 2017-03-19 DIAGNOSIS — I129 Hypertensive chronic kidney disease with stage 1 through stage 4 chronic kidney disease, or unspecified chronic kidney disease: Secondary | ICD-10-CM | POA: Diagnosis not present

## 2017-03-19 DIAGNOSIS — N183 Chronic kidney disease, stage 3 (moderate): Secondary | ICD-10-CM | POA: Diagnosis not present

## 2017-03-19 DIAGNOSIS — Z23 Encounter for immunization: Secondary | ICD-10-CM | POA: Diagnosis not present

## 2017-03-19 DIAGNOSIS — Z0000 Encounter for general adult medical examination without abnormal findings: Secondary | ICD-10-CM | POA: Diagnosis not present

## 2017-03-19 DIAGNOSIS — Z1389 Encounter for screening for other disorder: Secondary | ICD-10-CM | POA: Diagnosis not present

## 2017-03-27 DIAGNOSIS — H04122 Dry eye syndrome of left lacrimal gland: Secondary | ICD-10-CM | POA: Diagnosis not present

## 2017-03-27 DIAGNOSIS — H01021 Squamous blepharitis right upper eyelid: Secondary | ICD-10-CM | POA: Diagnosis not present

## 2017-03-28 DIAGNOSIS — I10 Essential (primary) hypertension: Secondary | ICD-10-CM | POA: Diagnosis not present

## 2017-03-28 DIAGNOSIS — J029 Acute pharyngitis, unspecified: Secondary | ICD-10-CM | POA: Diagnosis not present

## 2017-04-04 DIAGNOSIS — M4000 Postural kyphosis, site unspecified: Secondary | ICD-10-CM | POA: Diagnosis not present

## 2017-04-04 DIAGNOSIS — G9001 Carotid sinus syncope: Secondary | ICD-10-CM | POA: Diagnosis not present

## 2017-05-10 DIAGNOSIS — D2321 Other benign neoplasm of skin of right ear and external auricular canal: Secondary | ICD-10-CM | POA: Diagnosis not present

## 2017-05-10 DIAGNOSIS — Z872 Personal history of diseases of the skin and subcutaneous tissue: Secondary | ICD-10-CM | POA: Diagnosis not present

## 2017-05-10 DIAGNOSIS — C44629 Squamous cell carcinoma of skin of left upper limb, including shoulder: Secondary | ICD-10-CM | POA: Diagnosis not present

## 2017-05-10 DIAGNOSIS — D234 Other benign neoplasm of skin of scalp and neck: Secondary | ICD-10-CM | POA: Diagnosis not present

## 2017-05-10 DIAGNOSIS — D2339 Other benign neoplasm of skin of other parts of face: Secondary | ICD-10-CM | POA: Diagnosis not present

## 2017-05-10 DIAGNOSIS — D2362 Other benign neoplasm of skin of left upper limb, including shoulder: Secondary | ICD-10-CM | POA: Diagnosis not present

## 2017-05-10 DIAGNOSIS — D23122 Other benign neoplasm of skin of left lower eyelid, including canthus: Secondary | ICD-10-CM | POA: Diagnosis not present

## 2017-05-10 DIAGNOSIS — D2371 Other benign neoplasm of skin of right lower limb, including hip: Secondary | ICD-10-CM | POA: Diagnosis not present

## 2017-05-10 DIAGNOSIS — D23111 Other benign neoplasm of skin of right upper eyelid, including canthus: Secondary | ICD-10-CM | POA: Diagnosis not present

## 2017-05-10 DIAGNOSIS — D230 Other benign neoplasm of skin of lip: Secondary | ICD-10-CM | POA: Diagnosis not present

## 2017-05-10 DIAGNOSIS — D2361 Other benign neoplasm of skin of right upper limb, including shoulder: Secondary | ICD-10-CM | POA: Diagnosis not present

## 2017-05-10 DIAGNOSIS — Z85828 Personal history of other malignant neoplasm of skin: Secondary | ICD-10-CM | POA: Diagnosis not present

## 2017-05-10 DIAGNOSIS — D485 Neoplasm of uncertain behavior of skin: Secondary | ICD-10-CM | POA: Diagnosis not present

## 2017-05-22 ENCOUNTER — Emergency Department (HOSPITAL_COMMUNITY): Payer: Medicare Other

## 2017-05-22 ENCOUNTER — Other Ambulatory Visit: Payer: Self-pay

## 2017-05-22 ENCOUNTER — Emergency Department (HOSPITAL_COMMUNITY)
Admission: EM | Admit: 2017-05-22 | Discharge: 2017-05-22 | Disposition: A | Discharge status: Home / Self Care | Payer: Medicare Other | Attending: Emergency Medicine | Admitting: Emergency Medicine

## 2017-05-22 ENCOUNTER — Encounter (HOSPITAL_COMMUNITY): Payer: Self-pay

## 2017-05-22 DIAGNOSIS — Z79899 Other long term (current) drug therapy: Secondary | ICD-10-CM | POA: Insufficient documentation

## 2017-05-22 DIAGNOSIS — Z87891 Personal history of nicotine dependence: Secondary | ICD-10-CM | POA: Insufficient documentation

## 2017-05-22 DIAGNOSIS — R51 Headache: Secondary | ICD-10-CM | POA: Insufficient documentation

## 2017-05-22 DIAGNOSIS — I10 Essential (primary) hypertension: Secondary | ICD-10-CM | POA: Insufficient documentation

## 2017-05-22 DIAGNOSIS — R519 Headache, unspecified: Secondary | ICD-10-CM

## 2017-05-22 LAB — URINALYSIS, ROUTINE W REFLEX MICROSCOPIC
Bacteria, UA: NONE SEEN
Bilirubin Urine: NEGATIVE
GLUCOSE, UA: NEGATIVE mg/dL
KETONES UR: NEGATIVE mg/dL
Leukocytes, UA: NEGATIVE
Nitrite: NEGATIVE
PH: 5 (ref 5.0–8.0)
Protein, ur: 100 mg/dL — AB
Specific Gravity, Urine: 1.009 (ref 1.005–1.030)

## 2017-05-22 LAB — CBC WITH DIFFERENTIAL/PLATELET
Basophils Absolute: 0.2 10*3/uL — ABNORMAL HIGH (ref 0.0–0.1)
Basophils Relative: 1 %
EOS ABS: 0.2 10*3/uL (ref 0.0–0.7)
EOS PCT: 1 %
HCT: 46.6 % — ABNORMAL HIGH (ref 36.0–46.0)
Hemoglobin: 14.5 g/dL (ref 12.0–15.0)
LYMPHS ABS: 1.5 10*3/uL (ref 0.7–4.0)
LYMPHS PCT: 8 %
MCH: 27 pg (ref 26.0–34.0)
MCHC: 31.1 g/dL (ref 30.0–36.0)
MCV: 86.6 fL (ref 78.0–100.0)
MONO ABS: 1.2 10*3/uL — AB (ref 0.1–1.0)
Monocytes Relative: 6 %
Neutro Abs: 16.4 10*3/uL — ABNORMAL HIGH (ref 1.7–7.7)
Neutrophils Relative %: 84 %
PLATELETS: 400 10*3/uL (ref 150–400)
RBC: 5.38 MIL/uL — AB (ref 3.87–5.11)
RDW: 15 % (ref 11.5–15.5)
WBC: 19.4 10*3/uL — AB (ref 4.0–10.5)

## 2017-05-22 LAB — BASIC METABOLIC PANEL
Anion gap: 14 (ref 5–15)
BUN: 22 mg/dL — AB (ref 6–20)
CHLORIDE: 96 mmol/L — AB (ref 101–111)
CO2: 25 mmol/L (ref 22–32)
CREATININE: 0.97 mg/dL (ref 0.44–1.00)
Calcium: 9.6 mg/dL (ref 8.9–10.3)
GFR calc Af Amer: >60 mL/min (ref >60)
GFR calc non Af Amer: 53 mL/min — ABNORMAL LOW (ref >60)
GLUCOSE: 123 mg/dL — AB (ref 65–99)
POTASSIUM: 4 mmol/L (ref 3.5–5.1)
SODIUM: 135 mmol/L (ref 135–145)

## 2017-05-22 MED ORDER — ONDANSETRON HCL 4 MG/2ML IJ SOLN
4.0000 mg | Freq: Once | INTRAMUSCULAR | Status: AC
  Administered 2017-05-22: 4 mg via INTRAVENOUS
  Filled 2017-05-22: qty 2

## 2017-05-22 MED ORDER — SODIUM CHLORIDE 0.9 % IV BOLUS (SEPSIS)
1000.0000 mL | Freq: Once | INTRAVENOUS | Status: AC
  Administered 2017-05-22: 1000 mL via INTRAVENOUS

## 2017-05-22 MED ORDER — MORPHINE SULFATE (PF) 4 MG/ML IV SOLN
4.0000 mg | Freq: Once | INTRAVENOUS | Status: AC
  Administered 2017-05-22: 4 mg via INTRAVENOUS
  Filled 2017-05-22: qty 1

## 2017-05-22 MED ORDER — SODIUM CHLORIDE 0.9 % IV SOLN: INTRAVENOUS | Status: DC

## 2017-05-22 NOTE — ED Notes (Signed)
Pt states she does have to urinate at this time, aware of DO

## 2017-05-22 NOTE — ED Triage Notes (Signed)
Pt c/o headache and htn since yesterday evening.  bp 189/120 this morning.

## 2017-05-22 NOTE — ED Notes (Signed)
Pt transported to CT at this time.

## 2017-05-22 NOTE — ED Provider Notes (Signed)
[facility] EMERGENCY DEPARTMENT Provider Note   CSN: [id number] Arrival date & time: 05/22/17  1003     History   Chief Complaint Chief Complaint  Patient presents with  . Hypertension  . Headache    HPI [name] is a 84 y.o. female.  Pt presents to the ED today with h/a and elevated bp.  The pt developed the ha last night.  She checked her bp multiple times t/o the night and it remained elevated.  She did not take anything for her h/a.  She took all of her regular meds this morning and called the doctor on call and was told to take an extra amlodipine which she did.  H/a still there.  Pt denies any other sx.      Past Medical History:  Diagnosis Date  . Angioedema   . Back pain   . Hypertension   . Myeloproliferative disorder ([place]) 08/03/2014  . Osteoporosis   . Proteinuria   . Vitamin D deficiency     Patient Active Problem List   Diagnosis Date Noted  . Myeloproliferative disorder ([place]) 08/03/2014  . Heat syncope, subsequent encounter 04/03/2014  . Hematoma   . Faintness   . Leukocytosis   . Syncope 03/26/2014  . Essential hypertension 03/26/2014  . Elevated troponin 03/26/2014    Past Surgical History:  Procedure Laterality Date  . BREAST SURGERY    . SPINE SURGERY      OB History    No data available       Home Medications    Prior to Admission medications   Medication Sig Start Date End Date Taking? Authorizing Provider  amLODipine (NORVASC) 5 MG tablet Take 5 mg by mouth daily.   Yes [provider]  dipyridamole-aspirin (AGGRENOX) 200-25 MG per 12 hr capsule Take 1 capsule by mouth 2 (two) times daily.   Yes [provider]  gabapentin (NEURONTIN) 100 MG capsule Take 100 mg by mouth at bedtime.    Yes [provider]  metoprolol succinate (TOPROL-XL) 25 MG 24 hr tablet Take 25 mg by mouth daily.   Yes [provider]    Family History Family History  Family history unknown: Yes    Social  History Social History   Tobacco Use  . Smoking status: Former Smoker    Packs/day: 1.00    Years: 57.00    Pack years: 57.00    Types: Cigarettes    Start date: 08[birth year]05/1949    Last attempt to quit: 02/27/2009    Years since quitting: 8.2  . Smokeless tobacco: Never Used  . Tobacco comment: quit smoking 5 years ago  Substance Use Topics  . Alcohol use: No    Alcohol/week: 0.0 oz  . Drug use: No     Allergies   Quinapril   Review of Systems Review of Systems  Neurological: Positive for headaches.  All other systems reviewed and are negative.    Physical Exam Updated Vital Signs BP (!) 150/71   Pulse 71   Temp 97.8 F (36.6 C) (Oral)   Resp 16   Ht 5\' 3"  (1.6 m)   Wt 55.3 kg (122 lb)   SpO2 (!) 88%   BMI 21.61 kg/m   Physical Exam  Constitutional: She is oriented to person, place, and time. She appears well-developed and well-nourished.  HENT:  Head: Normocephalic and atraumatic.  Mouth/Throat: Oropharynx is clear and moist.  Eyes: EOM are normal. Pupils are equal, round, and reactive to light.  Neck: Normal range of motion. Neck supple.  Cardiovascular: Normal rate, regular rhythm and normal heart sounds.  Pulmonary/Chest: Effort normal and breath sounds normal.  Abdominal: Soft. Bowel sounds are normal.  Musculoskeletal: Normal range of motion.  Neurological: She is alert and oriented to person, place, and time. She has normal strength.  Skin: Skin is warm. Capillary refill takes less than 2 seconds.  Psychiatric: She has a normal mood and affect. Her behavior is normal.  Nursing note and vitals reviewed.    ED Treatments / Results  Labs (all labs ordered are listed, but only abnormal results are displayed) Labs Reviewed  BASIC METABOLIC PANEL - Abnormal; Notable for the following components:      Result Value   Chloride 96 (*)    Glucose, Bld 123 (*)    BUN 22 (*)    GFR calc non Af Amer 53 (*)    All other components within normal limits  CBC  WITH DIFFERENTIAL/PLATELET - Abnormal; Notable for the following components:   WBC 19.4 (*)    RBC 5.38 (*)    HCT 46.6 (*)    Neutro Abs 16.4 (*)    Monocytes Absolute 1.2 (*)    Basophils Absolute 0.2 (*)    All other components within normal limits  URINALYSIS, ROUTINE W REFLEX MICROSCOPIC - Abnormal; Notable for the following components:   Hgb urine dipstick SMALL (*)    Protein, ur 100 (*)    Squamous Epithelial / LPF 0-5 (*)    All other components within normal limits    EKG  EKG Interpretation  Date/Time:  Tuesday May 22 2017 10:28:56 EST Ventricular Rate:  68 PR Interval:    QRS Duration: 104 QT Interval:  394 QTC Calculation: 419 R Axis:   -49 Text Interpretation:  Sinus rhythm Incomplete RBBB and LAFB Consider right ventricular hypertrophy Abnrm T, consider ischemia, anterolateral lds No significant change since last tracing Confirmed by Isla Pence [phone]) on 05/22/2017 11:04:54 AM       Radiology Ct Head Wo Contrast  Result Date: 05/22/2017 CLINICAL DATA:  84 year old female with a history of headache and hypertension EXAM: CT HEAD WITHOUT CONTRAST TECHNIQUE: Contiguous axial images were obtained from the base of the skull through the vertex without intravenous contrast. COMPARISON:  05/03/2014, MR 03/27/2014 FINDINGS: Brain: No acute intracranial hemorrhage. No midline shift or mass effect. Gray-white differentiation maintained. Re- demonstration of confluent hypodensity in the periventricular white matter Unremarkable appearance of the ventricular system. Vascular: Unremarkable. Skull: No acute fracture.  No aggressive bone lesion identified. Sinuses/Orbits: Unremarkable appearance of the orbits. Mastoid air cells clear. No middle ear effusion. No significant sinus disease. Other: None IMPRESSION: Negative for acute abnormality. Evidence of chronic microvascular ischemic disease. Electronically Signed   By: Corrie Mckusick D.O.   On: 05/22/2017 11:23     Procedures Procedures (including critical care time)  Medications Ordered in ED Medications  sodium chloride 0.9 % bolus 1,000 mL (1,000 mLs Intravenous New Bag/Given 05/22/17 1045)    And  0.9 %  sodium chloride infusion (not administered)  ondansetron (ZOFRAN) injection 4 mg (4 mg Intravenous Given 05/22/17 1045)  morphine 4 MG/ML injection 4 mg (4 mg Intravenous Given 05/22/17 1045)     Initial Impression / Assessment and Plan / ED Course  I have reviewed the triage vital signs and the nursing notes.  Pertinent labs & imaging results that were available during my care of the patient were reviewed by me and considered in my  medical decision making (see chart for details).    Pt is feeling much better.  SBP down to 135.  Pt is stable for d/c.  She knows to f/u with her pcp and to return if worse.  Final Clinical Impressions(s) / ED Diagnoses   Final diagnoses:  Essential hypertension  Acute nonintractable headache, unspecified headache type    ED Discharge Orders    None       Isla Pence, MD 05/22/17 1226

## 2017-05-22 NOTE — ED Notes (Signed)
Placed on [place] 2 liters oxygen.

## 2017-05-28 DIAGNOSIS — I129 Hypertensive chronic kidney disease with stage 1 through stage 4 chronic kidney disease, or unspecified chronic kidney disease: Secondary | ICD-10-CM | POA: Diagnosis not present

## 2017-06-06 DIAGNOSIS — Z872 Personal history of diseases of the skin and subcutaneous tissue: Secondary | ICD-10-CM | POA: Diagnosis not present

## 2017-06-06 DIAGNOSIS — D234 Other benign neoplasm of skin of scalp and neck: Secondary | ICD-10-CM | POA: Diagnosis not present

## 2017-06-06 DIAGNOSIS — Z85828 Personal history of other malignant neoplasm of skin: Secondary | ICD-10-CM | POA: Diagnosis not present

## 2017-06-06 DIAGNOSIS — D23122 Other benign neoplasm of skin of left lower eyelid, including canthus: Secondary | ICD-10-CM | POA: Diagnosis not present

## 2017-06-06 DIAGNOSIS — D2339 Other benign neoplasm of skin of other parts of face: Secondary | ICD-10-CM | POA: Diagnosis not present

## 2017-06-06 DIAGNOSIS — D2361 Other benign neoplasm of skin of right upper limb, including shoulder: Secondary | ICD-10-CM | POA: Diagnosis not present

## 2017-06-06 DIAGNOSIS — D230 Other benign neoplasm of skin of lip: Secondary | ICD-10-CM | POA: Diagnosis not present

## 2017-06-06 DIAGNOSIS — D23111 Other benign neoplasm of skin of right upper eyelid, including canthus: Secondary | ICD-10-CM | POA: Diagnosis not present

## 2017-06-06 DIAGNOSIS — D2362 Other benign neoplasm of skin of left upper limb, including shoulder: Secondary | ICD-10-CM | POA: Diagnosis not present

## 2017-06-06 DIAGNOSIS — D2322 Other benign neoplasm of skin of left ear and external auricular canal: Secondary | ICD-10-CM | POA: Diagnosis not present

## 2017-06-06 DIAGNOSIS — D2321 Other benign neoplasm of skin of right ear and external auricular canal: Secondary | ICD-10-CM | POA: Diagnosis not present

## 2017-06-06 DIAGNOSIS — C44629 Squamous cell carcinoma of skin of left upper limb, including shoulder: Secondary | ICD-10-CM | POA: Diagnosis not present

## 2017-06-25 DIAGNOSIS — N183 Chronic kidney disease, stage 3 (moderate): Secondary | ICD-10-CM | POA: Diagnosis not present

## 2017-06-25 DIAGNOSIS — I129 Hypertensive chronic kidney disease with stage 1 through stage 4 chronic kidney disease, or unspecified chronic kidney disease: Secondary | ICD-10-CM | POA: Diagnosis not present

## 2017-06-25 DIAGNOSIS — D471 Chronic myeloproliferative disease: Secondary | ICD-10-CM | POA: Diagnosis not present

## 2017-08-06 DIAGNOSIS — I129 Hypertensive chronic kidney disease with stage 1 through stage 4 chronic kidney disease, or unspecified chronic kidney disease: Secondary | ICD-10-CM | POA: Diagnosis not present

## 2017-08-06 DIAGNOSIS — R413 Other amnesia: Secondary | ICD-10-CM | POA: Diagnosis not present

## 2017-09-18 DIAGNOSIS — N183 Chronic kidney disease, stage 3 (moderate): Secondary | ICD-10-CM | POA: Diagnosis not present

## 2017-09-18 DIAGNOSIS — I129 Hypertensive chronic kidney disease with stage 1 through stage 4 chronic kidney disease, or unspecified chronic kidney disease: Secondary | ICD-10-CM | POA: Diagnosis not present

## 2017-09-18 DIAGNOSIS — R413 Other amnesia: Secondary | ICD-10-CM | POA: Diagnosis not present

## 2017-10-23 DIAGNOSIS — H04122 Dry eye syndrome of left lacrimal gland: Secondary | ICD-10-CM | POA: Diagnosis not present

## 2017-10-23 DIAGNOSIS — H01021 Squamous blepharitis right upper eyelid: Secondary | ICD-10-CM | POA: Diagnosis not present

## 2017-10-23 DIAGNOSIS — H01024 Squamous blepharitis left upper eyelid: Secondary | ICD-10-CM | POA: Diagnosis not present

## 2017-11-19 DIAGNOSIS — H01021 Squamous blepharitis right upper eyelid: Secondary | ICD-10-CM | POA: Diagnosis not present

## 2017-11-19 DIAGNOSIS — H01024 Squamous blepharitis left upper eyelid: Secondary | ICD-10-CM | POA: Diagnosis not present

## 2017-11-19 DIAGNOSIS — H04122 Dry eye syndrome of left lacrimal gland: Secondary | ICD-10-CM | POA: Diagnosis not present

## 2017-11-30 DIAGNOSIS — R531 Weakness: Secondary | ICD-10-CM | POA: Diagnosis not present

## 2017-11-30 DIAGNOSIS — R319 Hematuria, unspecified: Secondary | ICD-10-CM | POA: Diagnosis not present

## 2017-11-30 DIAGNOSIS — R42 Dizziness and giddiness: Secondary | ICD-10-CM | POA: Diagnosis not present

## 2017-11-30 DIAGNOSIS — I499 Cardiac arrhythmia, unspecified: Secondary | ICD-10-CM | POA: Diagnosis not present

## 2017-12-05 DIAGNOSIS — R2689 Other abnormalities of gait and mobility: Secondary | ICD-10-CM | POA: Diagnosis not present

## 2017-12-05 DIAGNOSIS — R42 Dizziness and giddiness: Secondary | ICD-10-CM | POA: Diagnosis not present

## 2017-12-05 DIAGNOSIS — R262 Difficulty in walking, not elsewhere classified: Secondary | ICD-10-CM | POA: Diagnosis not present

## 2017-12-11 DIAGNOSIS — R42 Dizziness and giddiness: Secondary | ICD-10-CM | POA: Diagnosis not present

## 2017-12-11 DIAGNOSIS — R2689 Other abnormalities of gait and mobility: Secondary | ICD-10-CM | POA: Diagnosis not present

## 2017-12-11 DIAGNOSIS — R262 Difficulty in walking, not elsewhere classified: Secondary | ICD-10-CM | POA: Diagnosis not present

## 2017-12-17 DIAGNOSIS — Z79899 Other long term (current) drug therapy: Secondary | ICD-10-CM | POA: Diagnosis not present

## 2017-12-17 DIAGNOSIS — R413 Other amnesia: Secondary | ICD-10-CM | POA: Diagnosis not present

## 2017-12-17 DIAGNOSIS — R2689 Other abnormalities of gait and mobility: Secondary | ICD-10-CM | POA: Diagnosis not present

## 2017-12-17 DIAGNOSIS — R269 Unspecified abnormalities of gait and mobility: Secondary | ICD-10-CM | POA: Diagnosis not present

## 2017-12-17 DIAGNOSIS — N183 Chronic kidney disease, stage 3 (moderate): Secondary | ICD-10-CM | POA: Diagnosis not present

## 2017-12-17 DIAGNOSIS — I129 Hypertensive chronic kidney disease with stage 1 through stage 4 chronic kidney disease, or unspecified chronic kidney disease: Secondary | ICD-10-CM | POA: Diagnosis not present

## 2017-12-17 DIAGNOSIS — R42 Dizziness and giddiness: Secondary | ICD-10-CM | POA: Diagnosis not present

## 2017-12-17 DIAGNOSIS — R262 Difficulty in walking, not elsewhere classified: Secondary | ICD-10-CM | POA: Diagnosis not present

## 2017-12-24 DIAGNOSIS — R262 Difficulty in walking, not elsewhere classified: Secondary | ICD-10-CM | POA: Diagnosis not present

## 2017-12-24 DIAGNOSIS — R42 Dizziness and giddiness: Secondary | ICD-10-CM | POA: Diagnosis not present

## 2017-12-24 DIAGNOSIS — R2689 Other abnormalities of gait and mobility: Secondary | ICD-10-CM | POA: Diagnosis not present

## 2017-12-27 DIAGNOSIS — R2689 Other abnormalities of gait and mobility: Secondary | ICD-10-CM | POA: Diagnosis not present

## 2017-12-27 DIAGNOSIS — R42 Dizziness and giddiness: Secondary | ICD-10-CM | POA: Diagnosis not present

## 2017-12-27 DIAGNOSIS — R262 Difficulty in walking, not elsewhere classified: Secondary | ICD-10-CM | POA: Diagnosis not present

## 2018-01-08 DIAGNOSIS — R42 Dizziness and giddiness: Secondary | ICD-10-CM | POA: Diagnosis not present

## 2018-01-08 DIAGNOSIS — R2689 Other abnormalities of gait and mobility: Secondary | ICD-10-CM | POA: Diagnosis not present

## 2018-01-08 DIAGNOSIS — R262 Difficulty in walking, not elsewhere classified: Secondary | ICD-10-CM | POA: Diagnosis not present

## 2018-01-10 DIAGNOSIS — R42 Dizziness and giddiness: Secondary | ICD-10-CM | POA: Diagnosis not present

## 2018-01-10 DIAGNOSIS — R262 Difficulty in walking, not elsewhere classified: Secondary | ICD-10-CM | POA: Diagnosis not present

## 2018-01-10 DIAGNOSIS — R2689 Other abnormalities of gait and mobility: Secondary | ICD-10-CM | POA: Diagnosis not present

## 2018-02-13 DIAGNOSIS — L03119 Cellulitis of unspecified part of limb: Secondary | ICD-10-CM | POA: Diagnosis not present

## 2018-03-21 DIAGNOSIS — I129 Hypertensive chronic kidney disease with stage 1 through stage 4 chronic kidney disease, or unspecified chronic kidney disease: Secondary | ICD-10-CM | POA: Diagnosis not present

## 2018-03-21 DIAGNOSIS — R269 Unspecified abnormalities of gait and mobility: Secondary | ICD-10-CM | POA: Diagnosis not present

## 2018-03-21 DIAGNOSIS — Z79899 Other long term (current) drug therapy: Secondary | ICD-10-CM | POA: Diagnosis not present

## 2018-03-21 DIAGNOSIS — D692 Other nonthrombocytopenic purpura: Secondary | ICD-10-CM | POA: Diagnosis not present

## 2018-03-21 DIAGNOSIS — Z0000 Encounter for general adult medical examination without abnormal findings: Secondary | ICD-10-CM | POA: Diagnosis not present

## 2018-03-21 DIAGNOSIS — N183 Chronic kidney disease, stage 3 (moderate): Secondary | ICD-10-CM | POA: Diagnosis not present

## 2018-03-21 DIAGNOSIS — Z1389 Encounter for screening for other disorder: Secondary | ICD-10-CM | POA: Diagnosis not present

## 2018-03-21 DIAGNOSIS — R413 Other amnesia: Secondary | ICD-10-CM | POA: Diagnosis not present

## 2018-03-21 DIAGNOSIS — M810 Age-related osteoporosis without current pathological fracture: Secondary | ICD-10-CM | POA: Diagnosis not present

## 2018-03-21 DIAGNOSIS — H903 Sensorineural hearing loss, bilateral: Secondary | ICD-10-CM | POA: Diagnosis not present

## 2018-03-21 DIAGNOSIS — Z23 Encounter for immunization: Secondary | ICD-10-CM | POA: Diagnosis not present

## 2018-05-07 DIAGNOSIS — M6281 Muscle weakness (generalized): Secondary | ICD-10-CM | POA: Diagnosis not present

## 2018-05-07 DIAGNOSIS — R269 Unspecified abnormalities of gait and mobility: Secondary | ICD-10-CM | POA: Diagnosis not present

## 2018-05-10 DIAGNOSIS — R269 Unspecified abnormalities of gait and mobility: Secondary | ICD-10-CM | POA: Diagnosis not present

## 2018-05-10 DIAGNOSIS — M6281 Muscle weakness (generalized): Secondary | ICD-10-CM | POA: Diagnosis not present

## 2018-05-13 DIAGNOSIS — R269 Unspecified abnormalities of gait and mobility: Secondary | ICD-10-CM | POA: Diagnosis not present

## 2018-05-13 DIAGNOSIS — M6281 Muscle weakness (generalized): Secondary | ICD-10-CM | POA: Diagnosis not present

## 2018-05-17 DIAGNOSIS — R269 Unspecified abnormalities of gait and mobility: Secondary | ICD-10-CM | POA: Diagnosis not present

## 2018-05-17 DIAGNOSIS — M6281 Muscle weakness (generalized): Secondary | ICD-10-CM | POA: Diagnosis not present

## 2018-05-23 DIAGNOSIS — R269 Unspecified abnormalities of gait and mobility: Secondary | ICD-10-CM | POA: Diagnosis not present

## 2018-05-23 DIAGNOSIS — M6281 Muscle weakness (generalized): Secondary | ICD-10-CM | POA: Diagnosis not present

## 2018-05-27 DIAGNOSIS — R269 Unspecified abnormalities of gait and mobility: Secondary | ICD-10-CM | POA: Diagnosis not present

## 2018-05-27 DIAGNOSIS — M6281 Muscle weakness (generalized): Secondary | ICD-10-CM | POA: Diagnosis not present

## 2018-05-30 DIAGNOSIS — M6281 Muscle weakness (generalized): Secondary | ICD-10-CM | POA: Diagnosis not present

## 2018-05-30 DIAGNOSIS — R269 Unspecified abnormalities of gait and mobility: Secondary | ICD-10-CM | POA: Diagnosis not present

## 2018-06-03 DIAGNOSIS — R269 Unspecified abnormalities of gait and mobility: Secondary | ICD-10-CM | POA: Diagnosis not present

## 2018-06-03 DIAGNOSIS — M6281 Muscle weakness (generalized): Secondary | ICD-10-CM | POA: Diagnosis not present

## 2018-06-05 DIAGNOSIS — R269 Unspecified abnormalities of gait and mobility: Secondary | ICD-10-CM | POA: Diagnosis not present

## 2018-06-05 DIAGNOSIS — M6281 Muscle weakness (generalized): Secondary | ICD-10-CM | POA: Diagnosis not present

## 2018-06-10 DIAGNOSIS — D471 Chronic myeloproliferative disease: Secondary | ICD-10-CM | POA: Diagnosis not present

## 2018-06-10 DIAGNOSIS — M542 Cervicalgia: Secondary | ICD-10-CM | POA: Diagnosis not present

## 2018-06-10 DIAGNOSIS — I129 Hypertensive chronic kidney disease with stage 1 through stage 4 chronic kidney disease, or unspecified chronic kidney disease: Secondary | ICD-10-CM | POA: Diagnosis not present

## 2018-06-10 DIAGNOSIS — L853 Xerosis cutis: Secondary | ICD-10-CM | POA: Diagnosis not present

## 2018-06-10 DIAGNOSIS — D692 Other nonthrombocytopenic purpura: Secondary | ICD-10-CM | POA: Diagnosis not present

## 2018-06-10 DIAGNOSIS — N183 Chronic kidney disease, stage 3 (moderate): Secondary | ICD-10-CM | POA: Diagnosis not present

## 2018-06-10 DIAGNOSIS — R413 Other amnesia: Secondary | ICD-10-CM | POA: Diagnosis not present

## 2018-06-12 DIAGNOSIS — R269 Unspecified abnormalities of gait and mobility: Secondary | ICD-10-CM | POA: Diagnosis not present

## 2018-06-12 DIAGNOSIS — M6281 Muscle weakness (generalized): Secondary | ICD-10-CM | POA: Diagnosis not present

## 2018-06-14 DIAGNOSIS — R269 Unspecified abnormalities of gait and mobility: Secondary | ICD-10-CM | POA: Diagnosis not present

## 2018-06-14 DIAGNOSIS — M6281 Muscle weakness (generalized): Secondary | ICD-10-CM | POA: Diagnosis not present

## 2018-06-19 DIAGNOSIS — M6281 Muscle weakness (generalized): Secondary | ICD-10-CM | POA: Diagnosis not present

## 2018-06-19 DIAGNOSIS — R269 Unspecified abnormalities of gait and mobility: Secondary | ICD-10-CM | POA: Diagnosis not present

## 2018-06-25 DIAGNOSIS — H5789 Other specified disorders of eye and adnexa: Secondary | ICD-10-CM | POA: Diagnosis not present

## 2018-06-25 DIAGNOSIS — M542 Cervicalgia: Secondary | ICD-10-CM | POA: Diagnosis not present

## 2018-06-26 DIAGNOSIS — M6281 Muscle weakness (generalized): Secondary | ICD-10-CM | POA: Diagnosis not present

## 2018-06-26 DIAGNOSIS — R269 Unspecified abnormalities of gait and mobility: Secondary | ICD-10-CM | POA: Diagnosis not present

## 2018-07-04 DIAGNOSIS — M6281 Muscle weakness (generalized): Secondary | ICD-10-CM | POA: Diagnosis not present

## 2018-07-04 DIAGNOSIS — R269 Unspecified abnormalities of gait and mobility: Secondary | ICD-10-CM | POA: Diagnosis not present

## 2018-08-19 IMAGING — CT CT HEAD W/O CM
3 series · 16 of 47 positions shown, 19 images · non-contrast
Comparison: 05/03/2014, MR 03/27/2014

CLINICAL DATA: 85-year-old female with a history of headache and
hypertension

EXAM:
CT HEAD WITHOUT CONTRAST
TECHNIQUE: Contiguous axial images were obtained from the base of the skull
through the vertex without intravenous contrast.

[Series 2: head wo · axial · 0.44mm/px · z∈[+16,+151]mm · 10 of 33 slices shown, 13 images]
[im 3/33  brain]
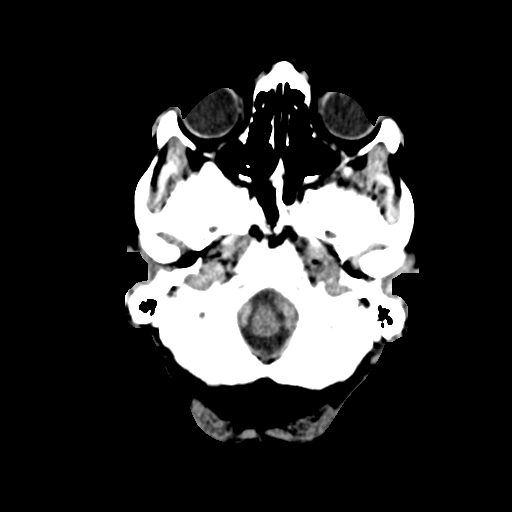
[im 3/33  bone]
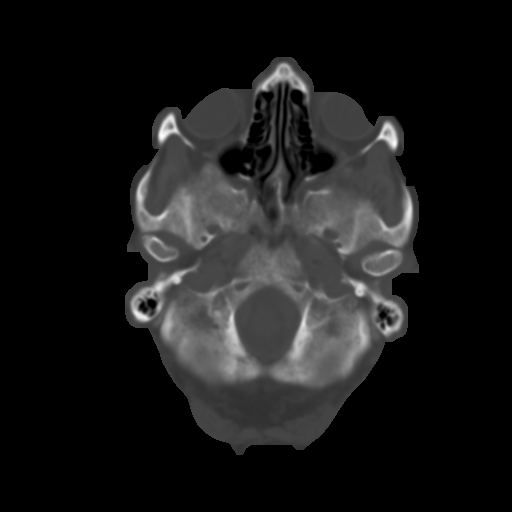
[im 6/33  brain]
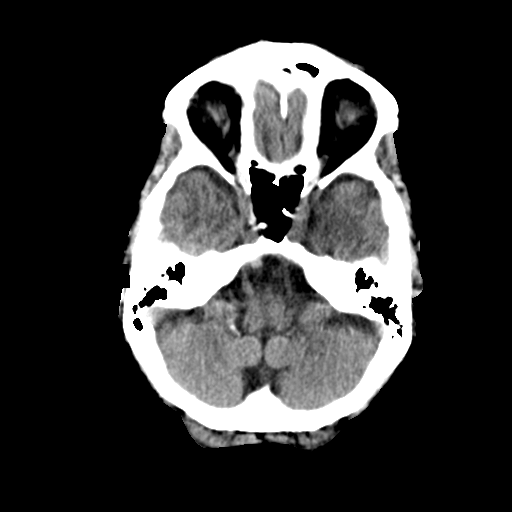
[im 9/33  brain]
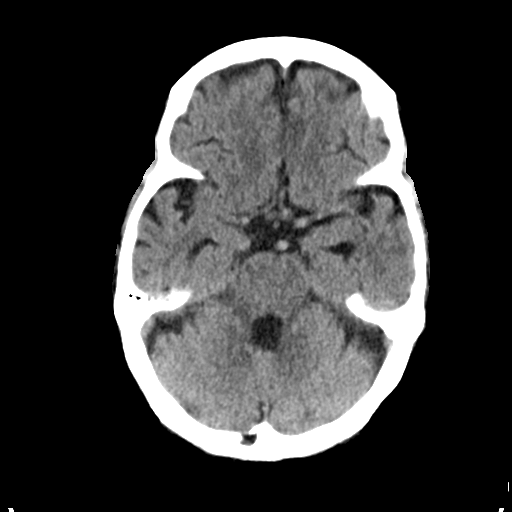
[im 12/33  brain]
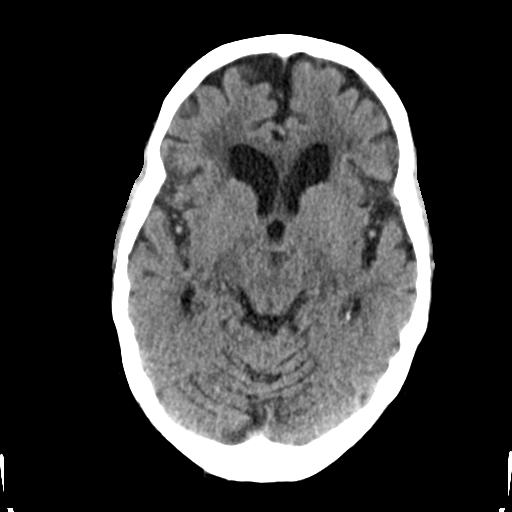
[im 15/33  brain]
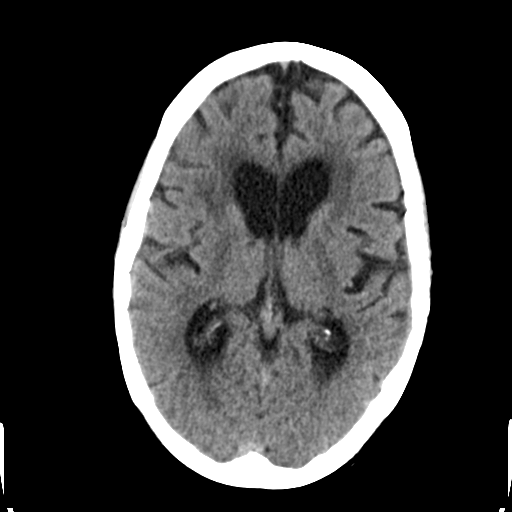
[im 15/33  bone]
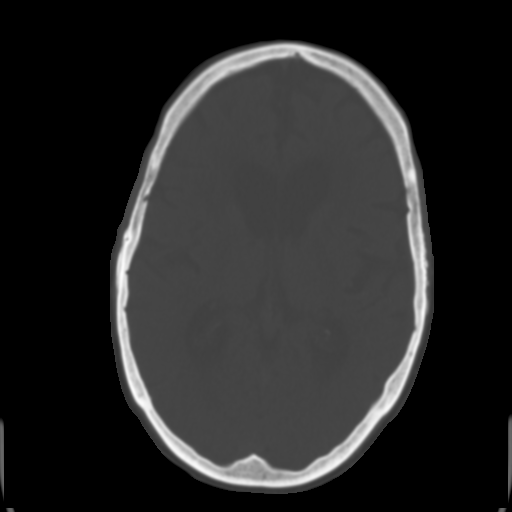
[im 18/33  brain]
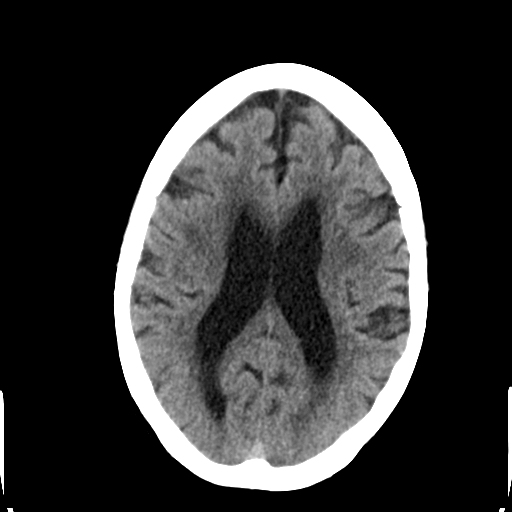
[im 21/33  brain]
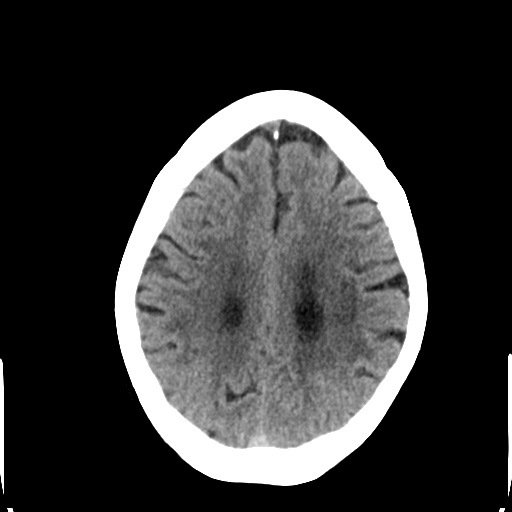
[im 25/33  brain]
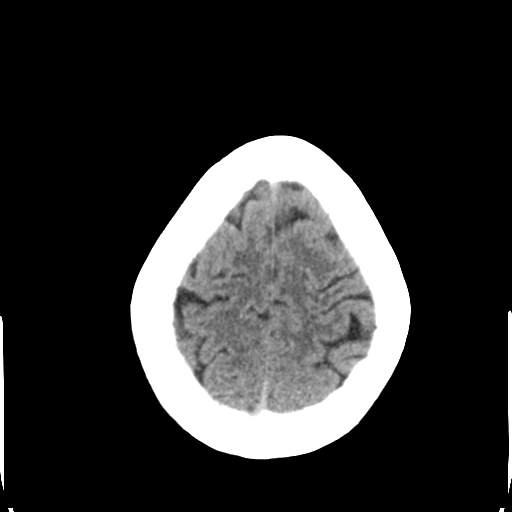
[im 27/33  brain]
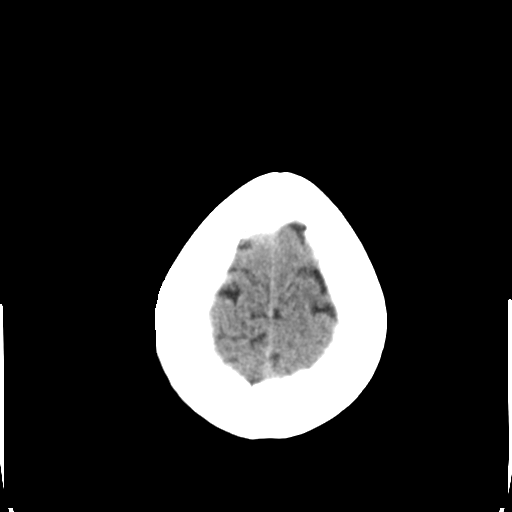
[im 27/33  bone]
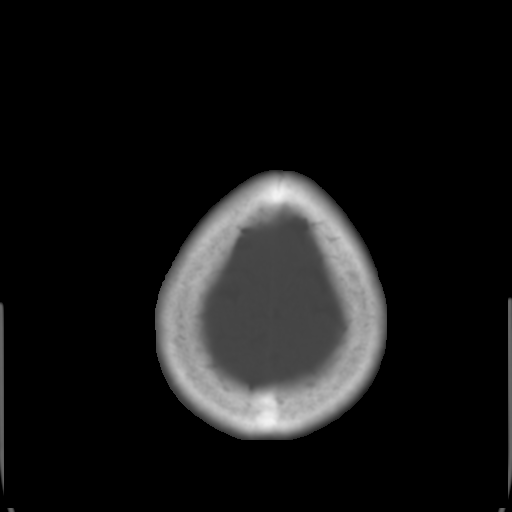
[im 30/33  brain]
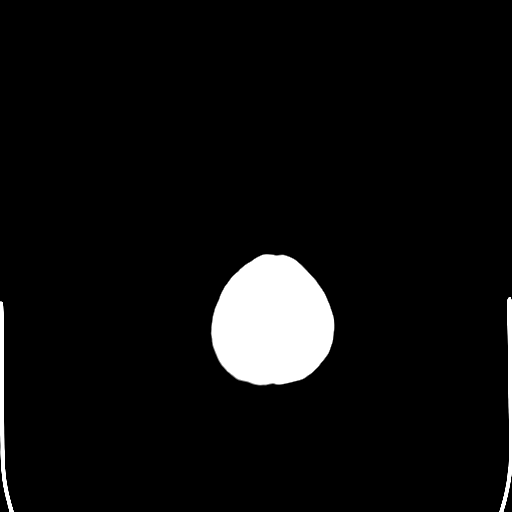

[Series 4: coronal soft tissue · coronal · 0.33mm/px · 3 of 68 slices shown]
[im 23/68  brain]
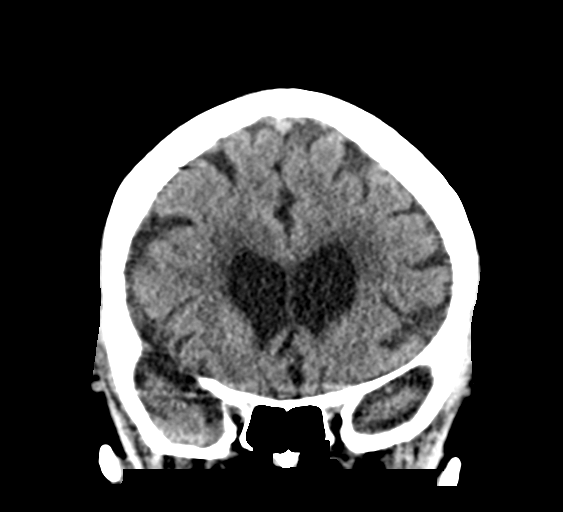
[im 30/68  brain]
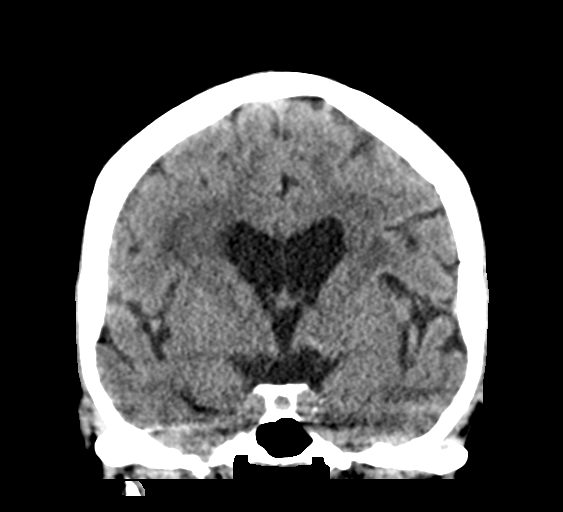
[im 38/68  brain]
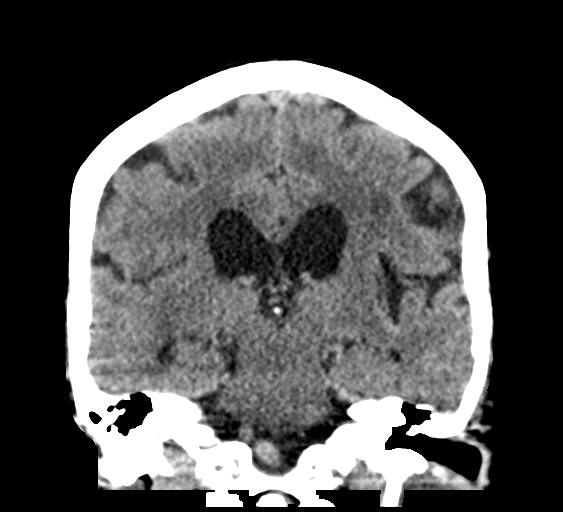

[Series 5: sagittal soft tissue · sagittal · 0.36mm/px · 3 of 49 slices shown]
[im 17/49  brain]
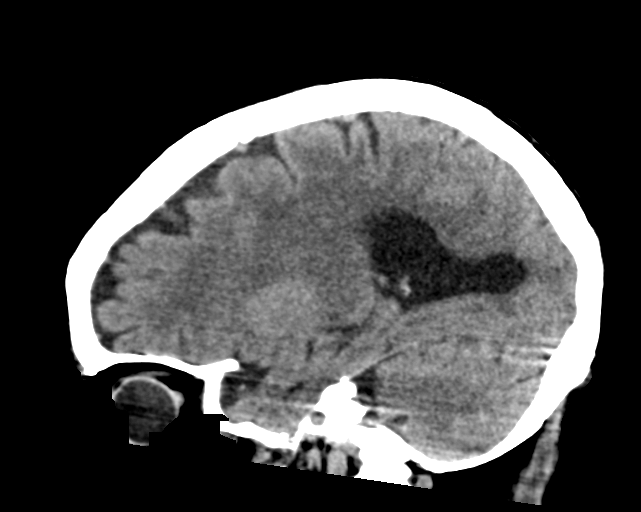
[im 25/49  brain]
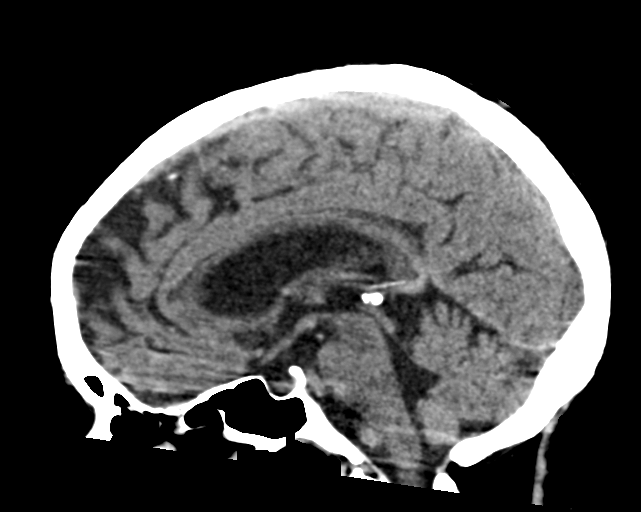
[im 33/49  brain]
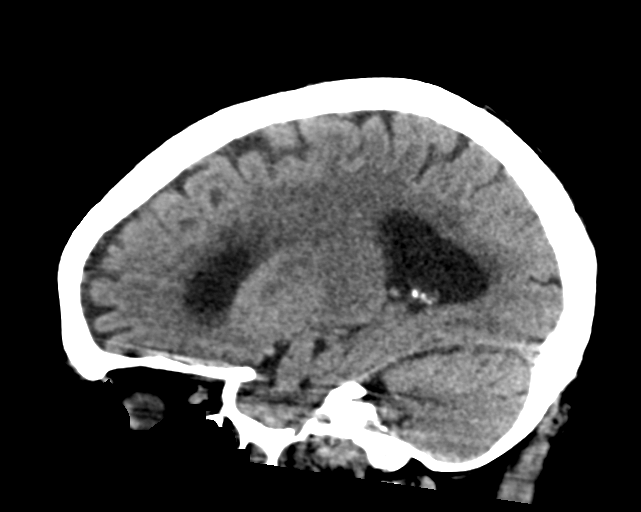

[16 of 47 positions shown; findings below may reference images not displayed]

FINDINGS: Brain: No acute intracranial hemorrhage. No midline shift or mass
effect. Gray-white differentiation maintained. Re- demonstration of
confluent hypodensity in the periventricular white matter
Unremarkable appearance of the ventricular system.

Vascular: Unremarkable.

Skull: No acute fracture.  No aggressive bone lesion identified.

Sinuses/Orbits: Unremarkable appearance of the orbits. Mastoid air
cells clear. No middle ear effusion. No significant sinus disease.

Other: None
IMPRESSION: Negative for acute abnormality.

Evidence of chronic microvascular ischemic disease.

## 2019-03-28 DIAGNOSIS — Z1389 Encounter for screening for other disorder: Secondary | ICD-10-CM | POA: Diagnosis not present

## 2019-03-28 DIAGNOSIS — N1831 Chronic kidney disease, stage 3a: Secondary | ICD-10-CM | POA: Diagnosis not present

## 2019-03-28 DIAGNOSIS — Z79899 Other long term (current) drug therapy: Secondary | ICD-10-CM | POA: Diagnosis not present

## 2019-03-28 DIAGNOSIS — Z0000 Encounter for general adult medical examination without abnormal findings: Secondary | ICD-10-CM | POA: Diagnosis not present

## 2019-03-28 DIAGNOSIS — R413 Other amnesia: Secondary | ICD-10-CM | POA: Diagnosis not present

## 2019-03-28 DIAGNOSIS — I129 Hypertensive chronic kidney disease with stage 1 through stage 4 chronic kidney disease, or unspecified chronic kidney disease: Secondary | ICD-10-CM | POA: Diagnosis not present

## 2019-03-28 DIAGNOSIS — Z23 Encounter for immunization: Secondary | ICD-10-CM | POA: Diagnosis not present

## 2019-05-05 DIAGNOSIS — R8290 Unspecified abnormal findings in urine: Secondary | ICD-10-CM | POA: Diagnosis not present

## 2019-05-05 DIAGNOSIS — M545 Low back pain: Secondary | ICD-10-CM | POA: Diagnosis not present

## 2019-05-21 DIAGNOSIS — I129 Hypertensive chronic kidney disease with stage 1 through stage 4 chronic kidney disease, or unspecified chronic kidney disease: Secondary | ICD-10-CM | POA: Diagnosis not present

## 2019-05-21 DIAGNOSIS — N183 Chronic kidney disease, stage 3 unspecified: Secondary | ICD-10-CM | POA: Diagnosis not present

## 2019-05-21 DIAGNOSIS — M810 Age-related osteoporosis without current pathological fracture: Secondary | ICD-10-CM | POA: Diagnosis not present

## 2019-06-03 DIAGNOSIS — N1831 Chronic kidney disease, stage 3a: Secondary | ICD-10-CM | POA: Diagnosis not present

## 2019-06-14 DIAGNOSIS — M810 Age-related osteoporosis without current pathological fracture: Secondary | ICD-10-CM | POA: Diagnosis not present

## 2019-06-14 DIAGNOSIS — I129 Hypertensive chronic kidney disease with stage 1 through stage 4 chronic kidney disease, or unspecified chronic kidney disease: Secondary | ICD-10-CM | POA: Diagnosis not present

## 2019-06-14 DIAGNOSIS — N183 Chronic kidney disease, stage 3 unspecified: Secondary | ICD-10-CM | POA: Diagnosis not present

## 2019-06-20 ENCOUNTER — Encounter: Payer: Self-pay | Admitting: Urology

## 2019-06-20 ENCOUNTER — Other Ambulatory Visit (HOSPITAL_COMMUNITY)
Admission: RE | Admit: 2019-06-20 | Discharge: 2019-06-20 | Disposition: A | Discharge status: Home / Self Care | Payer: Medicare Other | Source: Other Acute Inpatient Hospital | Attending: Urology | Admitting: Urology

## 2019-06-20 ENCOUNTER — Other Ambulatory Visit: Payer: Self-pay

## 2019-06-20 ENCOUNTER — Ambulatory Visit (INDEPENDENT_AMBULATORY_CARE_PROVIDER_SITE_OTHER): Payer: Medicare Other | Admitting: Urology

## 2019-06-20 VITALS — BP 172/82 | HR 68 | Temp 97.9°F | Ht 63.0 in | Wt 111.0 lb

## 2019-06-20 DIAGNOSIS — R3129 Other microscopic hematuria: Secondary | ICD-10-CM | POA: Insufficient documentation

## 2019-06-20 DIAGNOSIS — N3941 Urge incontinence: Secondary | ICD-10-CM | POA: Diagnosis not present

## 2019-06-20 DIAGNOSIS — N1831 Chronic kidney disease, stage 3a: Secondary | ICD-10-CM | POA: Insufficient documentation

## 2019-06-20 LAB — URINALYSIS, ROUTINE W REFLEX MICROSCOPIC
Bilirubin Urine: NEGATIVE
Glucose, UA: 50 mg/dL — AB
Ketones, ur: 5 mg/dL — AB
Leukocytes,Ua: NEGATIVE
Nitrite: NEGATIVE
Protein, ur: >=300 mg/dL — AB
RBC / HPF: >50 RBC/hpf — ABNORMAL HIGH (ref 0–5)
Specific Gravity, Urine: 1.019 (ref 1.005–1.030)
pH: 5 (ref 5.0–8.0)

## 2019-06-20 LAB — POCT URINALYSIS DIPSTICK
Bilirubin, UA: NEGATIVE
Glucose, UA: NEGATIVE
Ketones, UA: NEGATIVE
Nitrite, UA: NEGATIVE
Protein, UA: POSITIVE — AB
Spec Grav, UA: >=1.03 — AB (ref 1.010–1.025)
Urobilinogen, UA: 0.2 E.U./dL
pH, UA: 5 (ref 5.0–8.0)

## 2019-06-20 NOTE — Progress Notes (Signed)
Subjective: 1. Microscopic hematuria   2. Urge incontinence     [name] is an 86 yo female who is sent in consultation by Dr. Felipa Eth for microhematuria with >20RBC's on a UA on 05/07/19.  She had a culture but I can't find the results.   She was not given antibiotics.   She has CKD3 but her last Cr had improved from 1.51 to 1.33 this month.  She has had no gross hematuria.  She has had no UTI's. She has no history of stones.  She has had no GU surgery.  She is a former smoker.  She has 3+ blood on the dip today.  She has some frequency, urgency and nocturia.  She has UUI and wears pads.     ROS:  Review of Systems  Constitutional: Negative for weight loss.  Respiratory: Negative for shortness of breath.   Cardiovascular: Negative for chest pain.  Endo/Heme/Allergies: Bruises/bleeds easily.  Psychiatric/Behavioral: Positive for memory loss.  All other systems reviewed and are negative.   Allergies  Allergen Reactions  . Quinapril     unknown    Past Medical History:  Diagnosis Date  . Angioedema   . Back pain   . Breast cancer ([place])   . Hypertension   . Myeloproliferative disorder ([place]) 08/03/2014  . Osteoporosis   . Proteinuria   . Vitamin D deficiency     Past Surgical History:  Procedure Laterality Date  . BREAST SURGERY    . SPINE SURGERY      Social History   Socioeconomic History  . Marital status: Widowed    Spouse name: Not on file  . Number of children: Not on file  . Years of education: Not on file  . Highest education level: Not on file  Occupational History  . Not on file  Tobacco Use  . Smoking status: Former Smoker    Packs/day: 1.00    Years: 20.00    Pack years: 20.00    Types: Cigarettes    Start date: 08/05/1951    Quit date: 02/27/2009    Years since quitting: 10.3  . Smokeless tobacco: Never Used  . Tobacco comment: quit smoking 15 years ago  Substance and Sexual Activity  . Alcohol use: No    Alcohol/week: 0.0 standard drinks  .  Drug use: No  . Sexual activity: Not on file  Other Topics Concern  . Not on file  Social History Narrative  . Not on file   Social Determinants of Health   Financial Resource Strain:   . Difficulty of Paying Living Expenses: Not on file  Food Insecurity:   . Worried About Charity fundraiser in the Last Year: Not on file  . Ran Out of Food in the Last Year: Not on file  Transportation Needs:   . Lack of Transportation (Medical): Not on file  . Lack of Transportation (Non-Medical): Not on file  Physical Activity:   . Days of Exercise per Week: Not on file  . Minutes of Exercise per Session: Not on file  Stress:   . Feeling of Stress : Not on file  Social Connections:   . Frequency of Communication with Friends and Family: Not on file  . Frequency of Social Gatherings with Friends and Family: Not on file  . Attends Religious Services: Not on file  . Active Member of Clubs or Organizations: Not on file  . Attends Archivist Meetings: Not on file  . Marital Status: Not on  file  Intimate Partner Violence:   . Fear of Current or Ex-Partner: Not on file  . Emotionally Abused: Not on file  . Physically Abused: Not on file  . Sexually Abused: Not on file    Family History  Problem Relation Age of Onset  . Heart Problems Mother   . Heart Problems Father     Anti-infectives: Anti-infectives (From admission, onward)   None      Current Outpatient Medications  Medication Sig Dispense Refill  . amLODipine (NORVASC) 10 MG tablet Take 10 mg by mouth daily.    Marland Kitchen donepezil (ARICEPT) 10 MG tablet Take 10 mg by mouth at bedtime.    . memantine (NAMENDA) 10 MG tablet Take 10 mg by mouth 2 (two) times daily.    . metoprolol succinate (TOPROL-XL) 50 MG 24 hr tablet Take 50 mg by mouth daily.    Marland Kitchen amLODipine (NORVASC) 5 MG tablet Take 5 mg by mouth daily.    Marland Kitchen dipyridamole-aspirin (AGGRENOX) 200-25 MG per 12 hr capsule Take 1 capsule by mouth 2 (two) times daily.    Marland Kitchen  gabapentin (NEURONTIN) 100 MG capsule Take 100 mg by mouth at bedtime.     . metoprolol succinate (TOPROL-XL) 25 MG 24 hr tablet Take 25 mg by mouth daily.     No current facility-administered medications for this visit.     Objective: Vital signs in last 24 hours: @VSRANGES @  Intake/Output from previous day: No intake/output data recorded. Intake/Output this shift: @IOTHISSHIFT @   Physical Exam Vitals reviewed.  Constitutional:      Appearance: Normal appearance.  HENT:     Head: Normocephalic and atraumatic.  Cardiovascular:     Rate and Rhythm: Normal rate and regular rhythm.     Heart sounds: Normal heart sounds.  Pulmonary:     Breath sounds: Normal breath sounds.  Abdominal:     General: Abdomen is flat.     Palpations: Abdomen is soft. There is no mass.     Tenderness: There is no abdominal tenderness.  Musculoskeletal:        General: No swelling or tenderness. Normal range of motion.     Cervical back: Normal range of motion and neck supple.  Lymphadenopathy:     Cervical: No cervical adenopathy.  Skin:    General: Skin is warm and dry.  Neurological:     General: No focal deficit present.     Mental Status: She is alert and oriented to person, place, and time.  Psychiatric:        Mood and Affect: Mood normal.        Behavior: Behavior normal.     Lab Results:  Results for orders placed or performed in visit on 06/20/19 (from the past 24 hour(s))  POCT urinalysis dipstick     Status: Abnormal   Collection Time: 06/20/19 11:13 AM  Result Value Ref Range   Color, UA darkyellow    Clarity, UA     Glucose, UA Negative Negative   Bilirubin, UA neg    Ketones, UA neg    Spec Grav, UA >=1.030 (A) 1.010 - 1.025   Blood, UA 3+    pH, UA 5.0 5.0 - 8.0   Protein, UA Positive (A) Negative   Urobilinogen, UA 0.2 0.2 or 1.0 E.U./dL   Nitrite, UA neg    Leukocytes, UA Trace (A) Negative   Appearance clear    Odor       Studies/Results: I have reviewed  her records  from [place].  Her son signed into the [facility] Portal so we could get information.  Labs and UA's reviewed.    Subsequently notes were sent and reviewed.    Assessment/Plan: 1. Microhematuria.   I will send a UA micro today and get her set up for a CT hematuria study.   She will return with the results for possible cystoscopy.  2. CKD3.  Her last Cr was 1.33.   3. Urge incontinence.  We can address that after the hematuria evaluation.   No orders of the defined types were placed in this encounter.    Orders Placed This Encounter  Procedures  . CT HEMATURIA WORKUP    Creatinine was 1.33 on about 06/03/19 with Dr. Felipa Eth.    Standing Status:   Future    Standing Expiration Date:   07/20/2019    Order Specific Question:   Reason for Exam (SYMPTOM  OR DIAGNOSIS REQUIRED)    Answer:   microscopic hematuria    Order Specific Question:   Preferred imaging location?    Answer:   [facility]    Order Specific Question:   Radiology Contrast Protocol - do NOT remove file path    Answer:   [email or web address]  . Urinalysis, Routine w reflex microscopic    Standing Status:   Future    Standing Expiration Date:   06/23/2019  . POCT urinalysis dipstick     Return in about 1 week (around 06/27/2019) for With CT results for possible cystoscopy. .    CC: Dr. Lajean Manes.      Irine Seal 06/20/2019 [phone]

## 2019-06-25 ENCOUNTER — Other Ambulatory Visit: Payer: Self-pay

## 2019-06-27 ENCOUNTER — Telehealth: Payer: Self-pay

## 2019-06-27 ENCOUNTER — Encounter (HOSPITAL_COMMUNITY)
Admission: RE | Admit: 2019-06-27 | Discharge: 2019-06-27 | Disposition: A | Discharge status: Home / Self Care | Payer: Medicare Other | Source: Ambulatory Visit | Attending: Urology | Admitting: Urology

## 2019-06-27 DIAGNOSIS — R3129 Other microscopic hematuria: Secondary | ICD-10-CM | POA: Insufficient documentation

## 2019-06-27 NOTE — Telephone Encounter (Signed)
-----   Message from Irine Seal, MD sent at 06/26/2019  8:12 AM EST ----- UA confirms hematuria.   F/u with CT for cystoscopy as ordered.  ----- Message ----- From: Dorisann Frames, RN Sent: 06/24/2019   1:07 PM EST To: Irine Seal, MD  UA result

## 2019-06-27 NOTE — Telephone Encounter (Signed)
Notified son, Ed. Pt has CT scheduled next Thursday and f/u appt with Dr. Jeffie Pollock on 2/12

## 2019-07-03 ENCOUNTER — Ambulatory Visit (HOSPITAL_COMMUNITY): Payer: PRIVATE HEALTH INSURANCE

## 2019-07-03 ENCOUNTER — Ambulatory Visit (HOSPITAL_COMMUNITY)
Admission: RE | Admit: 2019-07-03 | Discharge: 2019-07-03 | Disposition: A | Discharge status: Home / Self Care | Payer: Medicare Other | Source: Ambulatory Visit | Attending: Urology | Admitting: Urology

## 2019-07-03 ENCOUNTER — Other Ambulatory Visit: Payer: Self-pay

## 2019-07-03 DIAGNOSIS — N200 Calculus of kidney: Secondary | ICD-10-CM | POA: Diagnosis not present

## 2019-07-03 DIAGNOSIS — R3129 Other microscopic hematuria: Secondary | ICD-10-CM | POA: Diagnosis not present

## 2019-07-03 MED ORDER — IOHEXOL 300 MG/ML  SOLN
80.0000 mL | Freq: Once | INTRAMUSCULAR | Status: AC | PRN
  Administered 2019-07-03: 12:00:00 80 mL via INTRAVENOUS

## 2019-07-04 ENCOUNTER — Ambulatory Visit: Payer: PRIVATE HEALTH INSURANCE | Admitting: Urology

## 2019-07-04 ENCOUNTER — Other Ambulatory Visit (HOSPITAL_COMMUNITY)
Admission: RE | Admit: 2019-07-04 | Discharge: 2019-07-04 | Disposition: A | Discharge status: Home / Self Care | Payer: Medicare Other | Source: Ambulatory Visit | Attending: Urology | Admitting: Urology

## 2019-07-04 ENCOUNTER — Encounter: Payer: Self-pay | Admitting: Urology

## 2019-07-04 ENCOUNTER — Ambulatory Visit (INDEPENDENT_AMBULATORY_CARE_PROVIDER_SITE_OTHER): Payer: Medicare Other | Admitting: Urology

## 2019-07-04 VITALS — BP 171/87 | HR 67 | Temp 97.7°F | Ht 63.0 in | Wt 112.0 lb

## 2019-07-04 DIAGNOSIS — N281 Cyst of kidney, acquired: Secondary | ICD-10-CM

## 2019-07-04 DIAGNOSIS — Z23 Encounter for immunization: Secondary | ICD-10-CM | POA: Diagnosis not present

## 2019-07-04 DIAGNOSIS — R3129 Other microscopic hematuria: Secondary | ICD-10-CM

## 2019-07-04 DIAGNOSIS — N200 Calculus of kidney: Secondary | ICD-10-CM | POA: Insufficient documentation

## 2019-07-04 DIAGNOSIS — N3941 Urge incontinence: Secondary | ICD-10-CM | POA: Diagnosis not present

## 2019-07-04 LAB — POCT URINALYSIS DIPSTICK
Bilirubin, UA: NEGATIVE
Glucose, UA: NEGATIVE
Ketones, UA: NEGATIVE
Leukocytes, UA: NEGATIVE
Nitrite, UA: NEGATIVE
Protein, UA: POSITIVE — AB
Spec Grav, UA: >=1.03 — AB (ref 1.010–1.025)
Urobilinogen, UA: 1 E.U./dL
pH, UA: 5 (ref 5.0–8.0)

## 2019-07-04 MED ORDER — CIPROFLOXACIN HCL 500 MG PO TABS
500.0000 mg | ORAL_TABLET | Freq: Once | ORAL | Status: AC
  Administered 2019-07-04: 14:00:00 500 mg via ORAL

## 2019-07-04 NOTE — Addendum Note (Signed)
Addended by: Valentina Lucks on: 07/04/2019 01:56 PM   Modules accepted: Orders

## 2019-07-04 NOTE — Progress Notes (Signed)
Urological Symptom Review  Patient is experiencing the following symptoms: Hard to postpone   Review of Systems  Gastrointestinal (upper)  : Negative for upper GI symptoms  Gastrointestinal (lower) : Negative for lower GI symptoms  Constitutional : None  Skin: Negative for skin symptoms  Eyes: Negative for eye symptoms  Ear/Nose/Throat : Negative for Ear/Nose/Throat symptoms  Hematologic/Lymphatic: Negative for Hematologic/Lymphatic symptoms  Cardiovascular : Negative for cardiovascular symptoms  Respiratory : Negative for respiratory symptoms  Endocrine: Negative for endocrine symptoms  Musculoskeletal: Negative for musculoskeletal symptoms  Neurological: Negative for neurological symptoms  Psychologic: Negative for psychiatric symptoms

## 2019-07-04 NOTE — Progress Notes (Signed)
Subjective:  1. Microscopic hematuria   2. Urge incontinence   3. Nephrolithiasis   4. Renal cyst      07/04/19: [name] returns today in f/u for cystoscopy to complete her hematuria w/u.   She had small bilateral renal stones and right renal cysts on CT but no other explanation for the hematuria.   She had some back pain about 6 weeks ago.  The pain lasted about 3 days and was fairly severe.  The pain resolved acutely.    06/20/19: [name] is an 86 yo female who is sent in consultation by Dr. Felipa Eth for microhematuria with >20RBC's on a UA on 05/07/19.  She had a culture but I can't find the results.   She was not given antibiotics.   She has CKD3 but her last Cr had improved from 1.51 to 1.33 this month.  She has had no gross hematuria.  She has had no UTI's. She has no history of stones.  She has had no GU surgery.  She is a former smoker.  She has 3+ blood on the dip today.  She has some frequency, urgency and nocturia.  She has UUI and wears pads.      ROS:  ROS:  A complete review of systems was performed.  All systems are negative except for pertinent findings as noted.   ROS  Allergies  Allergen Reactions  . Quinapril     unknown    Outpatient Encounter Medications as of 07/04/2019  Medication Sig  . amLODipine (NORVASC) 10 MG tablet Take 10 mg by mouth 2 (two) times daily.   Marland Kitchen dipyridamole-aspirin (AGGRENOX) 200-25 MG per 12 hr capsule Take 1 capsule by mouth 2 (two) times daily.  Marland Kitchen donepezil (ARICEPT) 10 MG tablet Take 10 mg by mouth at bedtime.  . furosemide (LASIX) 20 MG tablet Take 20 mg by mouth daily.  . metoprolol succinate (TOPROL-XL) 50 MG 24 hr tablet Take 50 mg by mouth daily.  . [DISCONTINUED] amLODipine (NORVASC) 5 MG tablet Take 5 mg by mouth daily.  . [DISCONTINUED] gabapentin (NEURONTIN) 100 MG capsule Take 100 mg by mouth at bedtime.   . [DISCONTINUED] memantine (NAMENDA) 10 MG tablet Take 10 mg by mouth 2 (two) times daily.  . [DISCONTINUED]  metoprolol succinate (TOPROL-XL) 25 MG 24 hr tablet Take 25 mg by mouth daily.   No facility-administered encounter medications on file as of 07/04/2019.    Past Medical History:  Diagnosis Date  . Angioedema   . Back pain   . Breast cancer ([place])   . Hypertension   . Myeloproliferative disorder ([place]) 08/03/2014  . Osteoporosis   . Proteinuria   . Vitamin D deficiency     Past Surgical History:  Procedure Laterality Date  . BREAST SURGERY    . SPINE SURGERY      Social History   Socioeconomic History  . Marital status: Widowed    Spouse name: Not on file  . Number of children: Not on file  . Years of education: Not on file  . Highest education level: Not on file  Occupational History  . Not on file  Tobacco Use  . Smoking status: Former Smoker    Packs/day: 1.00    Years: 20.00    Pack years: 20.00    Types: Cigarettes    Start date: 08/05/1951    Quit date: 02/27/2009    Years since quitting: 10.3  . Smokeless tobacco: Never Used  . Tobacco comment: quit smoking 15 years  ago  Substance and Sexual Activity  . Alcohol use: No    Alcohol/week: 0.0 standard drinks  . Drug use: No  . Sexual activity: Not on file  Other Topics Concern  . Not on file  Social History Narrative  . Not on file   Social Determinants of Health   Financial Resource Strain:   . Difficulty of Paying Living Expenses: Not on file  Food Insecurity:   . Worried About Charity fundraiser in the Last Year: Not on file  . Ran Out of Food in the Last Year: Not on file  Transportation Needs:   . Lack of Transportation (Medical): Not on file  . Lack of Transportation (Non-Medical): Not on file  Physical Activity:   . Days of Exercise per Week: Not on file  . Minutes of Exercise per Session: Not on file  Stress:   . Feeling of Stress : Not on file  Social Connections:   . Frequency of Communication with Friends and Family: Not on file  . Frequency of Social Gatherings with Friends and Family:  Not on file  . Attends Religious Services: Not on file  . Active Member of Clubs or Organizations: Not on file  . Attends Archivist Meetings: Not on file  . Marital Status: Not on file  Intimate Partner Violence:   . Fear of Current or Ex-Partner: Not on file  . Emotionally Abused: Not on file  . Physically Abused: Not on file  . Sexually Abused: Not on file    Family History  Problem Relation Age of Onset  . Heart Problems Mother   . Heart Problems Father        Objective: BP (!) 171/87   Pulse 67   Temp 97.7 F (36.5 C)   Ht 5\' 3"  (1.6 m)   Wt 112 lb (50.8 kg)   BMI 19.84 kg/m     Physical Exam  Lab Results:  Results for orders placed or performed in visit on 07/04/19 (from the past 24 hour(s))  POCT urinalysis dipstick     Status: Abnormal   Collection Time: 07/04/19  9:28 AM  Result Value Ref Range   Color, UA yellow    Clarity, UA clear    Glucose, UA Negative Negative   Bilirubin, UA neg    Ketones, UA neg    Spec Grav, UA >=1.030 (A) 1.010 - 1.025   Blood, UA large    pH, UA 5.0 5.0 - 8.0   Protein, UA Positive (A) Negative   Urobilinogen, UA 1.0 0.2 or 1.0 E.U./dL   Nitrite, UA neg    Leukocytes, UA Negative Negative   Appearance clear    Odor      BMET No results for input(s): NA, K, CL, CO2, GLUCOSE, BUN, CREATININE, CALCIUM in the last 72 hours. PSA No results found for: PSA No results found for: TESTOSTERONE    Studies/Results: CT HEMATURIA WORKUP  Result Date: 07/03/2019 CLINICAL DATA:  Microscopic hematuria EXAM: CT ABDOMEN AND PELVIS WITHOUT AND WITH CONTRAST TECHNIQUE: Multidetector CT imaging of the abdomen and pelvis was performed following the standard protocol before and following the bolus administration of intravenous contrast. CONTRAST:  101mL OMNIPAQUE IOHEXOL 300 MG/ML  SOLN COMPARISON:  None. FINDINGS: Lower chest: No acute abnormality. Moderate hiatal hernia. Cardiomegaly. Hepatobiliary: No solid liver abnormality  is seen. Multiple subcentimeter low-attenuation lesions of the liver, incompletely characterized due to size although likely small cysts or hemangiomata. No gallstones, gallbladder wall thickening, or  biliary dilatation. Pancreas: Unremarkable. No pancreatic ductal dilatation or surrounding inflammatory changes. Spleen: Splenomegaly, maximum coronal span 15.5 cm. Adrenals/Urinary Tract: Adrenal glands are unremarkable. Multiple small nonobstructive bilateral renal calculi. Nonenhancing simple cysts of the bilateral kidneys. The distal left ureter is poorly opacified. Within this limitation, no evidence of urinary tract filling defect. Bladder is unremarkable. Stomach/Bowel: Stomach is within normal limits. Pancolonic diverticulosis, severe in the descending and sigmoid colon. Vascular/Lymphatic: Tortuous and severely atherosclerotic aorta. No enlarged abdominal or pelvic lymph nodes. Reproductive: Status post hysterectomy. Other: Nonobstructed small bowel containing right inguinal hernia. No abdominopelvic ascites. Musculoskeletal: Levoscoliosis of the thoracolumbar spine, apex L2, with associated disc and facet degenerative disease. IMPRESSION: 1. Bilateral nonobstructive nephrolithiasis. 2. Nonenhancing bilateral renal cysts without evidence of solid renal mass. 3. The distal left ureter is poorly opacified. Within this limitation, no evidence of urinary tract filling defect. 4. Severe diverticulosis. 5. Nonobstructed small bowel containing right inguinal hernia. 6. Splenomegaly. 7. Aortic Atherosclerosis (ICD10-I70.0). Electronically Signed   By: Eddie Candle M.D.   On: 07/03/2019 15:11    Procedure: Flexible cystoscopy.  Preop diagnosis: Microhematuria.  Postop diagnosis: Same.  Surgeon: Dr. Irine Seal.  Anesthesia: None.  Complications: None.  Indications: Microhematuria and urge incontinence.  Procedure: She was placed in lithotomy position and prepped with Betadine solution.  The flexible  cystoscope was lubricated and inserted per urethra.  Examination revealed a normal urethra.  The bladder wall had mild trabeculation but no tumors, stones or inflammation.  Ureteral orifices were unremarkable.  Assessment & Plan: 1. Microhematuria with bilateral lower pole renal stones but no other finding to explain the hematuria.  I will send a cytology and if that is negative just have her return in 6 months.   2. Right renal cysts.  These are benign and need no further evaluation.  3. Urgency with incontinence.  She is minimally symptomatic with this an I will avoid anticholinergics since she has preexisting memory issues.  Her BP is too high for Myrbetriq.   No orders of the defined types were placed in this encounter.    Orders Placed This Encounter  Procedures  . POCT urinalysis dipstick      Return in about 6 months (around 01/01/2020) for reevaluation of the hematuria.  .   CC: Lajean Manes, MD      Irine Seal 07/04/2019

## 2019-07-07 LAB — CYTOLOGY - NON PAP

## 2019-07-08 ENCOUNTER — Telehealth: Payer: Self-pay

## 2019-07-08 NOTE — Telephone Encounter (Signed)
Pt notified of results

## 2019-07-08 NOTE — Telephone Encounter (Signed)
-----   Message from Irine Seal, MD sent at 07/08/2019  9:49 AM EST ----- Negative cytology ----- Message ----- From: Dorisann Frames, RN Sent: 07/08/2019   8:18 AM EST To: Irine Seal, MD  Please review

## 2019-07-18 DIAGNOSIS — N183 Chronic kidney disease, stage 3 unspecified: Secondary | ICD-10-CM | POA: Diagnosis not present

## 2019-07-18 DIAGNOSIS — M810 Age-related osteoporosis without current pathological fracture: Secondary | ICD-10-CM | POA: Diagnosis not present

## 2019-07-18 DIAGNOSIS — I129 Hypertensive chronic kidney disease with stage 1 through stage 4 chronic kidney disease, or unspecified chronic kidney disease: Secondary | ICD-10-CM | POA: Diagnosis not present

## 2019-08-01 DIAGNOSIS — Z23 Encounter for immunization: Secondary | ICD-10-CM | POA: Diagnosis not present

## 2019-09-12 DIAGNOSIS — N183 Chronic kidney disease, stage 3 unspecified: Secondary | ICD-10-CM | POA: Diagnosis not present

## 2019-09-12 DIAGNOSIS — M810 Age-related osteoporosis without current pathological fracture: Secondary | ICD-10-CM | POA: Diagnosis not present

## 2019-09-12 DIAGNOSIS — I129 Hypertensive chronic kidney disease with stage 1 through stage 4 chronic kidney disease, or unspecified chronic kidney disease: Secondary | ICD-10-CM | POA: Diagnosis not present

## 2019-10-13 DIAGNOSIS — L989 Disorder of the skin and subcutaneous tissue, unspecified: Secondary | ICD-10-CM | POA: Diagnosis not present

## 2019-10-16 DIAGNOSIS — N183 Chronic kidney disease, stage 3 unspecified: Secondary | ICD-10-CM | POA: Diagnosis not present

## 2019-10-16 DIAGNOSIS — M810 Age-related osteoporosis without current pathological fracture: Secondary | ICD-10-CM | POA: Diagnosis not present

## 2019-10-16 DIAGNOSIS — I129 Hypertensive chronic kidney disease with stage 1 through stage 4 chronic kidney disease, or unspecified chronic kidney disease: Secondary | ICD-10-CM | POA: Diagnosis not present

## 2019-10-23 DIAGNOSIS — D692 Other nonthrombocytopenic purpura: Secondary | ICD-10-CM | POA: Diagnosis not present

## 2019-10-23 DIAGNOSIS — D485 Neoplasm of uncertain behavior of skin: Secondary | ICD-10-CM | POA: Diagnosis not present

## 2019-10-23 DIAGNOSIS — C44729 Squamous cell carcinoma of skin of left lower limb, including hip: Secondary | ICD-10-CM | POA: Diagnosis not present

## 2019-12-10 DIAGNOSIS — Z4802 Encounter for removal of sutures: Secondary | ICD-10-CM | POA: Diagnosis not present

## 2020-01-09 ENCOUNTER — Ambulatory Visit: Payer: PRIVATE HEALTH INSURANCE | Admitting: Urology

## 2020-02-19 DIAGNOSIS — N183 Chronic kidney disease, stage 3 unspecified: Secondary | ICD-10-CM | POA: Diagnosis not present

## 2020-02-19 DIAGNOSIS — I129 Hypertensive chronic kidney disease with stage 1 through stage 4 chronic kidney disease, or unspecified chronic kidney disease: Secondary | ICD-10-CM | POA: Diagnosis not present

## 2020-02-19 DIAGNOSIS — M810 Age-related osteoporosis without current pathological fracture: Secondary | ICD-10-CM | POA: Diagnosis not present

## 2020-03-05 DIAGNOSIS — R079 Chest pain, unspecified: Secondary | ICD-10-CM | POA: Diagnosis not present

## 2020-03-05 DIAGNOSIS — M810 Age-related osteoporosis without current pathological fracture: Secondary | ICD-10-CM | POA: Diagnosis not present

## 2020-03-05 DIAGNOSIS — R0902 Hypoxemia: Secondary | ICD-10-CM | POA: Diagnosis not present

## 2020-03-05 DIAGNOSIS — D649 Anemia, unspecified: Secondary | ICD-10-CM | POA: Diagnosis not present

## 2020-03-05 DIAGNOSIS — J811 Chronic pulmonary edema: Secondary | ICD-10-CM | POA: Diagnosis not present

## 2020-03-05 DIAGNOSIS — F0150 Vascular dementia without behavioral disturbance: Secondary | ICD-10-CM | POA: Diagnosis not present

## 2020-03-05 DIAGNOSIS — I5033 Acute on chronic diastolic (congestive) heart failure: Secondary | ICD-10-CM | POA: Diagnosis not present

## 2020-03-05 DIAGNOSIS — N183 Chronic kidney disease, stage 3 unspecified: Secondary | ICD-10-CM | POA: Diagnosis not present

## 2020-03-05 DIAGNOSIS — R0602 Shortness of breath: Secondary | ICD-10-CM | POA: Diagnosis not present

## 2020-03-05 DIAGNOSIS — J9601 Acute respiratory failure with hypoxia: Secondary | ICD-10-CM | POA: Diagnosis not present

## 2020-03-05 DIAGNOSIS — Z8673 Personal history of transient ischemic attack (TIA), and cerebral infarction without residual deficits: Secondary | ICD-10-CM | POA: Diagnosis not present

## 2020-03-05 DIAGNOSIS — N1832 Chronic kidney disease, stage 3b: Secondary | ICD-10-CM | POA: Diagnosis not present

## 2020-03-05 DIAGNOSIS — K449 Diaphragmatic hernia without obstruction or gangrene: Secondary | ICD-10-CM | POA: Diagnosis not present

## 2020-03-05 DIAGNOSIS — I129 Hypertensive chronic kidney disease with stage 1 through stage 4 chronic kidney disease, or unspecified chronic kidney disease: Secondary | ICD-10-CM | POA: Diagnosis not present

## 2020-03-05 DIAGNOSIS — I517 Cardiomegaly: Secondary | ICD-10-CM | POA: Diagnosis not present

## 2020-03-05 DIAGNOSIS — K2980 Duodenitis without bleeding: Secondary | ICD-10-CM | POA: Diagnosis not present

## 2020-03-05 DIAGNOSIS — N189 Chronic kidney disease, unspecified: Secondary | ICD-10-CM | POA: Diagnosis not present

## 2020-03-05 DIAGNOSIS — Z20822 Contact with and (suspected) exposure to covid-19: Secondary | ICD-10-CM | POA: Diagnosis not present

## 2020-03-05 DIAGNOSIS — J432 Centrilobular emphysema: Secondary | ICD-10-CM | POA: Diagnosis not present

## 2020-03-05 DIAGNOSIS — K259 Gastric ulcer, unspecified as acute or chronic, without hemorrhage or perforation: Secondary | ICD-10-CM | POA: Diagnosis not present

## 2020-03-05 DIAGNOSIS — I272 Pulmonary hypertension, unspecified: Secondary | ICD-10-CM | POA: Diagnosis not present

## 2020-03-05 DIAGNOSIS — I130 Hypertensive heart and chronic kidney disease with heart failure and stage 1 through stage 4 chronic kidney disease, or unspecified chronic kidney disease: Secondary | ICD-10-CM | POA: Diagnosis not present

## 2020-03-05 DIAGNOSIS — Z856 Personal history of leukemia: Secondary | ICD-10-CM | POA: Diagnosis not present

## 2020-03-05 DIAGNOSIS — J9811 Atelectasis: Secondary | ICD-10-CM | POA: Diagnosis not present

## 2020-03-05 DIAGNOSIS — J810 Acute pulmonary edema: Secondary | ICD-10-CM | POA: Diagnosis not present

## 2020-03-05 DIAGNOSIS — R9431 Abnormal electrocardiogram [ECG] [EKG]: Secondary | ICD-10-CM | POA: Diagnosis not present

## 2020-03-05 DIAGNOSIS — D509 Iron deficiency anemia, unspecified: Secondary | ICD-10-CM | POA: Diagnosis not present

## 2020-03-05 DIAGNOSIS — D631 Anemia in chronic kidney disease: Secondary | ICD-10-CM | POA: Diagnosis not present

## 2020-03-05 DIAGNOSIS — D72829 Elevated white blood cell count, unspecified: Secondary | ICD-10-CM | POA: Diagnosis not present

## 2020-03-06 DIAGNOSIS — J9811 Atelectasis: Secondary | ICD-10-CM | POA: Diagnosis not present

## 2020-03-06 DIAGNOSIS — Z856 Personal history of leukemia: Secondary | ICD-10-CM | POA: Diagnosis not present

## 2020-03-06 DIAGNOSIS — K259 Gastric ulcer, unspecified as acute or chronic, without hemorrhage or perforation: Secondary | ICD-10-CM | POA: Diagnosis present

## 2020-03-06 DIAGNOSIS — D509 Iron deficiency anemia, unspecified: Secondary | ICD-10-CM | POA: Diagnosis present

## 2020-03-06 DIAGNOSIS — D649 Anemia, unspecified: Secondary | ICD-10-CM | POA: Diagnosis not present

## 2020-03-06 DIAGNOSIS — I5032 Chronic diastolic (congestive) heart failure: Secondary | ICD-10-CM | POA: Diagnosis not present

## 2020-03-06 DIAGNOSIS — I10 Essential (primary) hypertension: Secondary | ICD-10-CM | POA: Diagnosis not present

## 2020-03-06 DIAGNOSIS — K440 Diaphragmatic hernia with obstruction, without gangrene: Secondary | ICD-10-CM | POA: Diagnosis not present

## 2020-03-06 DIAGNOSIS — D469 Myelodysplastic syndrome, unspecified: Secondary | ICD-10-CM | POA: Diagnosis not present

## 2020-03-06 DIAGNOSIS — I270 Primary pulmonary hypertension: Secondary | ICD-10-CM | POA: Diagnosis not present

## 2020-03-06 DIAGNOSIS — I5033 Acute on chronic diastolic (congestive) heart failure: Secondary | ICD-10-CM | POA: Diagnosis not present

## 2020-03-06 DIAGNOSIS — N183 Chronic kidney disease, stage 3 unspecified: Secondary | ICD-10-CM | POA: Diagnosis present

## 2020-03-06 DIAGNOSIS — F0390 Unspecified dementia without behavioral disturbance: Secondary | ICD-10-CM | POA: Diagnosis not present

## 2020-03-06 DIAGNOSIS — D631 Anemia in chronic kidney disease: Secondary | ICD-10-CM | POA: Diagnosis present

## 2020-03-06 DIAGNOSIS — Z8673 Personal history of transient ischemic attack (TIA), and cerebral infarction without residual deficits: Secondary | ICD-10-CM | POA: Diagnosis not present

## 2020-03-06 DIAGNOSIS — F0150 Vascular dementia without behavioral disturbance: Secondary | ICD-10-CM | POA: Diagnosis present

## 2020-03-06 DIAGNOSIS — D72829 Elevated white blood cell count, unspecified: Secondary | ICD-10-CM | POA: Diagnosis not present

## 2020-03-06 DIAGNOSIS — Z20822 Contact with and (suspected) exposure to covid-19: Secondary | ICD-10-CM | POA: Diagnosis present

## 2020-03-06 DIAGNOSIS — J432 Centrilobular emphysema: Secondary | ICD-10-CM | POA: Diagnosis present

## 2020-03-06 DIAGNOSIS — G933 Postviral fatigue syndrome: Secondary | ICD-10-CM | POA: Diagnosis not present

## 2020-03-06 DIAGNOSIS — K253 Acute gastric ulcer without hemorrhage or perforation: Secondary | ICD-10-CM | POA: Diagnosis not present

## 2020-03-06 DIAGNOSIS — R4182 Altered mental status, unspecified: Secondary | ICD-10-CM | POA: Diagnosis not present

## 2020-03-06 DIAGNOSIS — K449 Diaphragmatic hernia without obstruction or gangrene: Secondary | ICD-10-CM | POA: Diagnosis present

## 2020-03-06 DIAGNOSIS — I130 Hypertensive heart and chronic kidney disease with heart failure and stage 1 through stage 4 chronic kidney disease, or unspecified chronic kidney disease: Secondary | ICD-10-CM | POA: Diagnosis not present

## 2020-03-06 DIAGNOSIS — K2980 Duodenitis without bleeding: Secondary | ICD-10-CM | POA: Diagnosis present

## 2020-03-06 DIAGNOSIS — J811 Chronic pulmonary edema: Secondary | ICD-10-CM | POA: Diagnosis not present

## 2020-03-06 DIAGNOSIS — I517 Cardiomegaly: Secondary | ICD-10-CM | POA: Diagnosis not present

## 2020-03-06 DIAGNOSIS — R079 Chest pain, unspecified: Secondary | ICD-10-CM | POA: Diagnosis not present

## 2020-03-06 DIAGNOSIS — J189 Pneumonia, unspecified organism: Secondary | ICD-10-CM | POA: Diagnosis not present

## 2020-03-06 DIAGNOSIS — R0602 Shortness of breath: Secondary | ICD-10-CM | POA: Diagnosis not present

## 2020-03-06 DIAGNOSIS — K2901 Acute gastritis with bleeding: Secondary | ICD-10-CM | POA: Diagnosis not present

## 2020-03-06 DIAGNOSIS — I272 Pulmonary hypertension, unspecified: Secondary | ICD-10-CM | POA: Diagnosis present

## 2020-03-06 DIAGNOSIS — N189 Chronic kidney disease, unspecified: Secondary | ICD-10-CM | POA: Diagnosis not present

## 2020-03-06 DIAGNOSIS — J810 Acute pulmonary edema: Secondary | ICD-10-CM | POA: Diagnosis not present

## 2020-03-06 DIAGNOSIS — I129 Hypertensive chronic kidney disease with stage 1 through stage 4 chronic kidney disease, or unspecified chronic kidney disease: Secondary | ICD-10-CM | POA: Diagnosis not present

## 2020-03-06 DIAGNOSIS — K2981 Duodenitis with bleeding: Secondary | ICD-10-CM | POA: Diagnosis not present

## 2020-03-06 DIAGNOSIS — J9601 Acute respiratory failure with hypoxia: Secondary | ICD-10-CM | POA: Diagnosis not present

## 2020-03-12 DIAGNOSIS — K2981 Duodenitis with bleeding: Secondary | ICD-10-CM | POA: Diagnosis not present

## 2020-03-12 DIAGNOSIS — F0150 Vascular dementia without behavioral disturbance: Secondary | ICD-10-CM | POA: Diagnosis not present

## 2020-03-12 DIAGNOSIS — I5032 Chronic diastolic (congestive) heart failure: Secondary | ICD-10-CM | POA: Diagnosis not present

## 2020-03-12 DIAGNOSIS — M79604 Pain in right leg: Secondary | ICD-10-CM | POA: Diagnosis not present

## 2020-03-12 DIAGNOSIS — J9601 Acute respiratory failure with hypoxia: Secondary | ICD-10-CM | POA: Diagnosis not present

## 2020-03-12 DIAGNOSIS — R0902 Hypoxemia: Secondary | ICD-10-CM | POA: Diagnosis not present

## 2020-03-12 DIAGNOSIS — M810 Age-related osteoporosis without current pathological fracture: Secondary | ICD-10-CM | POA: Diagnosis not present

## 2020-03-12 DIAGNOSIS — N183 Chronic kidney disease, stage 3 unspecified: Secondary | ICD-10-CM | POA: Diagnosis not present

## 2020-03-12 DIAGNOSIS — R4182 Altered mental status, unspecified: Secondary | ICD-10-CM | POA: Diagnosis not present

## 2020-03-12 DIAGNOSIS — I10 Essential (primary) hypertension: Secondary | ICD-10-CM | POA: Diagnosis not present

## 2020-03-12 DIAGNOSIS — D469 Myelodysplastic syndrome, unspecified: Secondary | ICD-10-CM | POA: Diagnosis not present

## 2020-03-12 DIAGNOSIS — I270 Primary pulmonary hypertension: Secondary | ICD-10-CM | POA: Diagnosis not present

## 2020-03-12 DIAGNOSIS — I509 Heart failure, unspecified: Secondary | ICD-10-CM | POA: Diagnosis not present

## 2020-03-12 DIAGNOSIS — G933 Postviral fatigue syndrome: Secondary | ICD-10-CM | POA: Diagnosis not present

## 2020-03-12 DIAGNOSIS — I129 Hypertensive chronic kidney disease with stage 1 through stage 4 chronic kidney disease, or unspecified chronic kidney disease: Secondary | ICD-10-CM | POA: Diagnosis not present

## 2020-03-12 DIAGNOSIS — N1832 Chronic kidney disease, stage 3b: Secondary | ICD-10-CM | POA: Diagnosis not present

## 2020-03-12 DIAGNOSIS — J189 Pneumonia, unspecified organism: Secondary | ICD-10-CM | POA: Diagnosis not present

## 2020-03-12 DIAGNOSIS — F0390 Unspecified dementia without behavioral disturbance: Secondary | ICD-10-CM | POA: Diagnosis not present

## 2020-03-12 DIAGNOSIS — K2901 Acute gastritis with bleeding: Secondary | ICD-10-CM | POA: Diagnosis not present

## 2020-03-16 DIAGNOSIS — I509 Heart failure, unspecified: Secondary | ICD-10-CM | POA: Diagnosis not present

## 2020-03-16 DIAGNOSIS — R0902 Hypoxemia: Secondary | ICD-10-CM | POA: Diagnosis not present

## 2020-03-18 DIAGNOSIS — J189 Pneumonia, unspecified organism: Secondary | ICD-10-CM | POA: Diagnosis not present

## 2020-03-23 DIAGNOSIS — R0902 Hypoxemia: Secondary | ICD-10-CM | POA: Diagnosis not present

## 2020-03-23 DIAGNOSIS — I509 Heart failure, unspecified: Secondary | ICD-10-CM | POA: Diagnosis not present

## 2020-03-30 DIAGNOSIS — N1832 Chronic kidney disease, stage 3b: Secondary | ICD-10-CM | POA: Diagnosis not present

## 2020-03-30 DIAGNOSIS — I129 Hypertensive chronic kidney disease with stage 1 through stage 4 chronic kidney disease, or unspecified chronic kidney disease: Secondary | ICD-10-CM | POA: Diagnosis not present

## 2020-03-30 DIAGNOSIS — M79604 Pain in right leg: Secondary | ICD-10-CM | POA: Diagnosis not present

## 2020-03-30 DIAGNOSIS — N183 Chronic kidney disease, stage 3 unspecified: Secondary | ICD-10-CM | POA: Diagnosis not present

## 2020-03-30 DIAGNOSIS — M810 Age-related osteoporosis without current pathological fracture: Secondary | ICD-10-CM | POA: Diagnosis not present

## 2020-04-06 DIAGNOSIS — I5033 Acute on chronic diastolic (congestive) heart failure: Secondary | ICD-10-CM | POA: Diagnosis not present

## 2020-04-06 DIAGNOSIS — D469 Myelodysplastic syndrome, unspecified: Secondary | ICD-10-CM | POA: Diagnosis not present

## 2020-04-06 DIAGNOSIS — D509 Iron deficiency anemia, unspecified: Secondary | ICD-10-CM | POA: Diagnosis not present

## 2020-04-06 DIAGNOSIS — H9193 Unspecified hearing loss, bilateral: Secondary | ICD-10-CM | POA: Diagnosis not present

## 2020-04-06 DIAGNOSIS — N183 Chronic kidney disease, stage 3 unspecified: Secondary | ICD-10-CM | POA: Diagnosis not present

## 2020-04-06 DIAGNOSIS — J9601 Acute respiratory failure with hypoxia: Secondary | ICD-10-CM | POA: Diagnosis not present

## 2020-04-06 DIAGNOSIS — K259 Gastric ulcer, unspecified as acute or chronic, without hemorrhage or perforation: Secondary | ICD-10-CM | POA: Diagnosis not present

## 2020-04-06 DIAGNOSIS — I272 Pulmonary hypertension, unspecified: Secondary | ICD-10-CM | POA: Diagnosis not present

## 2020-04-06 DIAGNOSIS — I130 Hypertensive heart and chronic kidney disease with heart failure and stage 1 through stage 4 chronic kidney disease, or unspecified chronic kidney disease: Secondary | ICD-10-CM | POA: Diagnosis not present

## 2020-04-06 DIAGNOSIS — K449 Diaphragmatic hernia without obstruction or gangrene: Secondary | ICD-10-CM | POA: Diagnosis not present

## 2020-04-06 DIAGNOSIS — F0390 Unspecified dementia without behavioral disturbance: Secondary | ICD-10-CM | POA: Diagnosis not present

## 2020-04-06 DIAGNOSIS — Z9981 Dependence on supplemental oxygen: Secondary | ICD-10-CM | POA: Diagnosis not present

## 2020-04-06 DIAGNOSIS — C4492 Squamous cell carcinoma of skin, unspecified: Secondary | ICD-10-CM | POA: Diagnosis not present

## 2020-04-06 DIAGNOSIS — Z8701 Personal history of pneumonia (recurrent): Secondary | ICD-10-CM | POA: Diagnosis not present

## 2020-04-09 DIAGNOSIS — N183 Chronic kidney disease, stage 3 unspecified: Secondary | ICD-10-CM | POA: Diagnosis not present

## 2020-04-09 DIAGNOSIS — J9601 Acute respiratory failure with hypoxia: Secondary | ICD-10-CM | POA: Diagnosis not present

## 2020-04-09 DIAGNOSIS — I130 Hypertensive heart and chronic kidney disease with heart failure and stage 1 through stage 4 chronic kidney disease, or unspecified chronic kidney disease: Secondary | ICD-10-CM | POA: Diagnosis not present

## 2020-04-09 DIAGNOSIS — I5033 Acute on chronic diastolic (congestive) heart failure: Secondary | ICD-10-CM | POA: Diagnosis not present

## 2020-04-09 DIAGNOSIS — I272 Pulmonary hypertension, unspecified: Secondary | ICD-10-CM | POA: Diagnosis not present

## 2020-04-09 DIAGNOSIS — D509 Iron deficiency anemia, unspecified: Secondary | ICD-10-CM | POA: Diagnosis not present

## 2020-04-12 DIAGNOSIS — D509 Iron deficiency anemia, unspecified: Secondary | ICD-10-CM | POA: Diagnosis not present

## 2020-04-12 DIAGNOSIS — I130 Hypertensive heart and chronic kidney disease with heart failure and stage 1 through stage 4 chronic kidney disease, or unspecified chronic kidney disease: Secondary | ICD-10-CM | POA: Diagnosis not present

## 2020-04-12 DIAGNOSIS — J9601 Acute respiratory failure with hypoxia: Secondary | ICD-10-CM | POA: Diagnosis not present

## 2020-04-12 DIAGNOSIS — N183 Chronic kidney disease, stage 3 unspecified: Secondary | ICD-10-CM | POA: Diagnosis not present

## 2020-04-12 DIAGNOSIS — I5033 Acute on chronic diastolic (congestive) heart failure: Secondary | ICD-10-CM | POA: Diagnosis not present

## 2020-04-12 DIAGNOSIS — I272 Pulmonary hypertension, unspecified: Secondary | ICD-10-CM | POA: Diagnosis not present

## 2020-04-13 DIAGNOSIS — N183 Chronic kidney disease, stage 3 unspecified: Secondary | ICD-10-CM | POA: Diagnosis not present

## 2020-04-13 DIAGNOSIS — I272 Pulmonary hypertension, unspecified: Secondary | ICD-10-CM | POA: Diagnosis not present

## 2020-04-13 DIAGNOSIS — D509 Iron deficiency anemia, unspecified: Secondary | ICD-10-CM | POA: Diagnosis not present

## 2020-04-13 DIAGNOSIS — I130 Hypertensive heart and chronic kidney disease with heart failure and stage 1 through stage 4 chronic kidney disease, or unspecified chronic kidney disease: Secondary | ICD-10-CM | POA: Diagnosis not present

## 2020-04-13 DIAGNOSIS — J9601 Acute respiratory failure with hypoxia: Secondary | ICD-10-CM | POA: Diagnosis not present

## 2020-04-13 DIAGNOSIS — I5033 Acute on chronic diastolic (congestive) heart failure: Secondary | ICD-10-CM | POA: Diagnosis not present

## 2020-04-19 DIAGNOSIS — I130 Hypertensive heart and chronic kidney disease with heart failure and stage 1 through stage 4 chronic kidney disease, or unspecified chronic kidney disease: Secondary | ICD-10-CM | POA: Diagnosis not present

## 2020-04-19 DIAGNOSIS — J9601 Acute respiratory failure with hypoxia: Secondary | ICD-10-CM | POA: Diagnosis not present

## 2020-04-19 DIAGNOSIS — N183 Chronic kidney disease, stage 3 unspecified: Secondary | ICD-10-CM | POA: Diagnosis not present

## 2020-04-19 DIAGNOSIS — D509 Iron deficiency anemia, unspecified: Secondary | ICD-10-CM | POA: Diagnosis not present

## 2020-04-19 DIAGNOSIS — I5033 Acute on chronic diastolic (congestive) heart failure: Secondary | ICD-10-CM | POA: Diagnosis not present

## 2020-04-19 DIAGNOSIS — I272 Pulmonary hypertension, unspecified: Secondary | ICD-10-CM | POA: Diagnosis not present

## 2020-04-22 DIAGNOSIS — D509 Iron deficiency anemia, unspecified: Secondary | ICD-10-CM | POA: Diagnosis not present

## 2020-04-22 DIAGNOSIS — I5033 Acute on chronic diastolic (congestive) heart failure: Secondary | ICD-10-CM | POA: Diagnosis not present

## 2020-04-22 DIAGNOSIS — N183 Chronic kidney disease, stage 3 unspecified: Secondary | ICD-10-CM | POA: Diagnosis not present

## 2020-04-22 DIAGNOSIS — I130 Hypertensive heart and chronic kidney disease with heart failure and stage 1 through stage 4 chronic kidney disease, or unspecified chronic kidney disease: Secondary | ICD-10-CM | POA: Diagnosis not present

## 2020-04-22 DIAGNOSIS — J9601 Acute respiratory failure with hypoxia: Secondary | ICD-10-CM | POA: Diagnosis not present

## 2020-04-22 DIAGNOSIS — I272 Pulmonary hypertension, unspecified: Secondary | ICD-10-CM | POA: Diagnosis not present

## 2020-04-26 DIAGNOSIS — I272 Pulmonary hypertension, unspecified: Secondary | ICD-10-CM | POA: Diagnosis not present

## 2020-04-26 DIAGNOSIS — D649 Anemia, unspecified: Secondary | ICD-10-CM | POA: Diagnosis not present

## 2020-04-26 DIAGNOSIS — I509 Heart failure, unspecified: Secondary | ICD-10-CM | POA: Diagnosis not present

## 2020-04-26 DIAGNOSIS — D509 Iron deficiency anemia, unspecified: Secondary | ICD-10-CM | POA: Diagnosis not present

## 2020-04-26 DIAGNOSIS — R531 Weakness: Secondary | ICD-10-CM | POA: Diagnosis not present

## 2020-04-26 DIAGNOSIS — N183 Chronic kidney disease, stage 3 unspecified: Secondary | ICD-10-CM | POA: Diagnosis not present

## 2020-04-26 DIAGNOSIS — D471 Chronic myeloproliferative disease: Secondary | ICD-10-CM | POA: Diagnosis not present

## 2020-04-26 DIAGNOSIS — K922 Gastrointestinal hemorrhage, unspecified: Secondary | ICD-10-CM | POA: Diagnosis not present

## 2020-04-26 DIAGNOSIS — J449 Chronic obstructive pulmonary disease, unspecified: Secondary | ICD-10-CM | POA: Diagnosis not present

## 2020-04-26 DIAGNOSIS — Z8673 Personal history of transient ischemic attack (TIA), and cerebral infarction without residual deficits: Secondary | ICD-10-CM | POA: Diagnosis not present

## 2020-04-26 DIAGNOSIS — I5033 Acute on chronic diastolic (congestive) heart failure: Secondary | ICD-10-CM | POA: Diagnosis not present

## 2020-04-26 DIAGNOSIS — I639 Cerebral infarction, unspecified: Secondary | ICD-10-CM | POA: Diagnosis not present

## 2020-04-26 DIAGNOSIS — R0902 Hypoxemia: Secondary | ICD-10-CM | POA: Diagnosis not present

## 2020-04-26 DIAGNOSIS — I130 Hypertensive heart and chronic kidney disease with heart failure and stage 1 through stage 4 chronic kidney disease, or unspecified chronic kidney disease: Secondary | ICD-10-CM | POA: Diagnosis not present

## 2020-04-26 DIAGNOSIS — R0602 Shortness of breath: Secondary | ICD-10-CM | POA: Diagnosis not present

## 2020-04-26 DIAGNOSIS — F0390 Unspecified dementia without behavioral disturbance: Secondary | ICD-10-CM | POA: Diagnosis not present

## 2020-04-26 DIAGNOSIS — J9601 Acute respiratory failure with hypoxia: Secondary | ICD-10-CM | POA: Diagnosis not present

## 2020-04-28 DIAGNOSIS — J9601 Acute respiratory failure with hypoxia: Secondary | ICD-10-CM | POA: Diagnosis not present

## 2020-04-28 DIAGNOSIS — I130 Hypertensive heart and chronic kidney disease with heart failure and stage 1 through stage 4 chronic kidney disease, or unspecified chronic kidney disease: Secondary | ICD-10-CM | POA: Diagnosis not present

## 2020-04-28 DIAGNOSIS — N183 Chronic kidney disease, stage 3 unspecified: Secondary | ICD-10-CM | POA: Diagnosis not present

## 2020-04-28 DIAGNOSIS — I5033 Acute on chronic diastolic (congestive) heart failure: Secondary | ICD-10-CM | POA: Diagnosis not present

## 2020-04-28 DIAGNOSIS — D509 Iron deficiency anemia, unspecified: Secondary | ICD-10-CM | POA: Diagnosis not present

## 2020-04-28 DIAGNOSIS — I272 Pulmonary hypertension, unspecified: Secondary | ICD-10-CM | POA: Diagnosis not present

## 2020-04-29 DIAGNOSIS — D649 Anemia, unspecified: Secondary | ICD-10-CM | POA: Diagnosis not present

## 2020-04-29 DIAGNOSIS — Z862 Personal history of diseases of the blood and blood-forming organs and certain disorders involving the immune mechanism: Secondary | ICD-10-CM | POA: Diagnosis not present

## 2020-04-30 DIAGNOSIS — N183 Chronic kidney disease, stage 3 unspecified: Secondary | ICD-10-CM | POA: Diagnosis not present

## 2020-04-30 DIAGNOSIS — I130 Hypertensive heart and chronic kidney disease with heart failure and stage 1 through stage 4 chronic kidney disease, or unspecified chronic kidney disease: Secondary | ICD-10-CM | POA: Diagnosis not present

## 2020-04-30 DIAGNOSIS — I5033 Acute on chronic diastolic (congestive) heart failure: Secondary | ICD-10-CM | POA: Diagnosis not present

## 2020-04-30 DIAGNOSIS — D509 Iron deficiency anemia, unspecified: Secondary | ICD-10-CM | POA: Diagnosis not present

## 2020-04-30 DIAGNOSIS — J9601 Acute respiratory failure with hypoxia: Secondary | ICD-10-CM | POA: Diagnosis not present

## 2020-04-30 DIAGNOSIS — I272 Pulmonary hypertension, unspecified: Secondary | ICD-10-CM | POA: Diagnosis not present

## 2020-05-03 DIAGNOSIS — J9601 Acute respiratory failure with hypoxia: Secondary | ICD-10-CM | POA: Diagnosis not present

## 2020-05-03 DIAGNOSIS — D72829 Elevated white blood cell count, unspecified: Secondary | ICD-10-CM | POA: Diagnosis not present

## 2020-05-03 DIAGNOSIS — D473 Essential (hemorrhagic) thrombocythemia: Secondary | ICD-10-CM | POA: Diagnosis not present

## 2020-05-03 DIAGNOSIS — I5033 Acute on chronic diastolic (congestive) heart failure: Secondary | ICD-10-CM | POA: Diagnosis not present

## 2020-05-03 DIAGNOSIS — E611 Iron deficiency: Secondary | ICD-10-CM | POA: Diagnosis not present

## 2020-05-03 DIAGNOSIS — R897 Abnormal histological findings in specimens from other organs, systems and tissues: Secondary | ICD-10-CM | POA: Diagnosis not present

## 2020-05-03 DIAGNOSIS — Z862 Personal history of diseases of the blood and blood-forming organs and certain disorders involving the immune mechanism: Secondary | ICD-10-CM | POA: Diagnosis not present

## 2020-05-03 DIAGNOSIS — I130 Hypertensive heart and chronic kidney disease with heart failure and stage 1 through stage 4 chronic kidney disease, or unspecified chronic kidney disease: Secondary | ICD-10-CM | POA: Diagnosis not present

## 2020-05-03 DIAGNOSIS — I272 Pulmonary hypertension, unspecified: Secondary | ICD-10-CM | POA: Diagnosis not present

## 2020-05-03 DIAGNOSIS — N183 Chronic kidney disease, stage 3 unspecified: Secondary | ICD-10-CM | POA: Diagnosis not present

## 2020-05-03 DIAGNOSIS — D509 Iron deficiency anemia, unspecified: Secondary | ICD-10-CM | POA: Diagnosis not present

## 2020-05-03 DIAGNOSIS — D649 Anemia, unspecified: Secondary | ICD-10-CM | POA: Diagnosis not present

## 2020-05-03 DIAGNOSIS — D75839 Thrombocytosis, unspecified: Secondary | ICD-10-CM | POA: Diagnosis not present

## 2020-05-05 DIAGNOSIS — I130 Hypertensive heart and chronic kidney disease with heart failure and stage 1 through stage 4 chronic kidney disease, or unspecified chronic kidney disease: Secondary | ICD-10-CM | POA: Diagnosis not present

## 2020-05-05 DIAGNOSIS — I272 Pulmonary hypertension, unspecified: Secondary | ICD-10-CM | POA: Diagnosis not present

## 2020-05-05 DIAGNOSIS — I5033 Acute on chronic diastolic (congestive) heart failure: Secondary | ICD-10-CM | POA: Diagnosis not present

## 2020-05-05 DIAGNOSIS — J9601 Acute respiratory failure with hypoxia: Secondary | ICD-10-CM | POA: Diagnosis not present

## 2020-05-05 DIAGNOSIS — D509 Iron deficiency anemia, unspecified: Secondary | ICD-10-CM | POA: Diagnosis not present

## 2020-05-05 DIAGNOSIS — N183 Chronic kidney disease, stage 3 unspecified: Secondary | ICD-10-CM | POA: Diagnosis not present

## 2020-05-06 DIAGNOSIS — J9601 Acute respiratory failure with hypoxia: Secondary | ICD-10-CM | POA: Diagnosis not present

## 2020-05-07 DIAGNOSIS — D649 Anemia, unspecified: Secondary | ICD-10-CM | POA: Diagnosis not present

## 2020-05-07 DIAGNOSIS — D75839 Thrombocytosis, unspecified: Secondary | ICD-10-CM | POA: Diagnosis not present

## 2020-05-07 DIAGNOSIS — D72829 Elevated white blood cell count, unspecified: Secondary | ICD-10-CM | POA: Diagnosis not present

## 2020-05-10 DIAGNOSIS — D72829 Elevated white blood cell count, unspecified: Secondary | ICD-10-CM | POA: Diagnosis not present

## 2020-05-10 DIAGNOSIS — D649 Anemia, unspecified: Secondary | ICD-10-CM | POA: Diagnosis not present

## 2020-05-10 DIAGNOSIS — D75839 Thrombocytosis, unspecified: Secondary | ICD-10-CM | POA: Diagnosis not present

## 2020-05-17 DIAGNOSIS — Z20822 Contact with and (suspected) exposure to covid-19: Secondary | ICD-10-CM | POA: Diagnosis not present

## 2020-05-20 DIAGNOSIS — I129 Hypertensive chronic kidney disease with stage 1 through stage 4 chronic kidney disease, or unspecified chronic kidney disease: Secondary | ICD-10-CM | POA: Diagnosis not present

## 2020-05-20 DIAGNOSIS — N183 Chronic kidney disease, stage 3 unspecified: Secondary | ICD-10-CM | POA: Diagnosis not present

## 2020-05-20 DIAGNOSIS — M810 Age-related osteoporosis without current pathological fracture: Secondary | ICD-10-CM | POA: Diagnosis not present

## 2020-05-20 DIAGNOSIS — N1832 Chronic kidney disease, stage 3b: Secondary | ICD-10-CM | POA: Diagnosis not present

## 2020-05-21 DIAGNOSIS — J1282 Pneumonia due to coronavirus disease 2019: Secondary | ICD-10-CM | POA: Diagnosis not present

## 2020-05-21 DIAGNOSIS — B9729 Other coronavirus as the cause of diseases classified elsewhere: Secondary | ICD-10-CM | POA: Diagnosis not present

## 2020-05-21 DIAGNOSIS — R531 Weakness: Secondary | ICD-10-CM | POA: Diagnosis not present

## 2020-05-21 DIAGNOSIS — U071 COVID-19: Secondary | ICD-10-CM | POA: Diagnosis not present

## 2020-05-21 DIAGNOSIS — R0602 Shortness of breath: Secondary | ICD-10-CM | POA: Diagnosis not present

## 2020-05-21 DIAGNOSIS — R059 Cough, unspecified: Secondary | ICD-10-CM | POA: Diagnosis not present

## 2020-05-21 DIAGNOSIS — J90 Pleural effusion, not elsewhere classified: Secondary | ICD-10-CM | POA: Diagnosis not present

## 2020-05-21 DIAGNOSIS — J9621 Acute and chronic respiratory failure with hypoxia: Secondary | ICD-10-CM | POA: Diagnosis not present

## 2021-07-20 DEATH — deceased
